# Patient Record
Sex: Female | Born: 1990 | Race: White | Hispanic: No | Marital: Married | Smoking: Never smoker
Health system: Southern US, Community
[De-identification: ages and names within clinical notes are randomized; demographics above are authoritative.]

## PROBLEM LIST (undated history)

## (undated) ENCOUNTER — Inpatient Hospital Stay: Payer: Self-pay

## (undated) DIAGNOSIS — Z8759 Personal history of other complications of pregnancy, childbirth and the puerperium: Secondary | ICD-10-CM

## (undated) DIAGNOSIS — O0930 Supervision of pregnancy with insufficient antenatal care, unspecified trimester: Secondary | ICD-10-CM

## (undated) DIAGNOSIS — Z3480 Encounter for supervision of other normal pregnancy, unspecified trimester: Secondary | ICD-10-CM

## (undated) HISTORY — DX: Personal history of other complications of pregnancy, childbirth and the puerperium: Z87.59

## (undated) HISTORY — PX: IUD REMOVAL: SHX5392

## (undated) HISTORY — DX: Encounter for supervision of other normal pregnancy, unspecified trimester: Z34.80

## (undated) HISTORY — DX: Supervision of pregnancy with insufficient antenatal care, unspecified trimester: O09.30

---

## 2017-11-27 ENCOUNTER — Ambulatory Visit (INDEPENDENT_AMBULATORY_CARE_PROVIDER_SITE_OTHER): Payer: Medicaid Other | Admitting: Obstetrics and Gynecology

## 2017-11-27 ENCOUNTER — Encounter: Payer: Self-pay | Admitting: Radiology

## 2017-11-27 ENCOUNTER — Encounter: Payer: Self-pay | Admitting: Obstetrics and Gynecology

## 2017-11-27 DIAGNOSIS — O0930 Supervision of pregnancy with insufficient antenatal care, unspecified trimester: Secondary | ICD-10-CM | POA: Insufficient documentation

## 2017-11-27 DIAGNOSIS — Z3482 Encounter for supervision of other normal pregnancy, second trimester: Secondary | ICD-10-CM | POA: Diagnosis not present

## 2017-11-27 DIAGNOSIS — Z3480 Encounter for supervision of other normal pregnancy, unspecified trimester: Secondary | ICD-10-CM | POA: Insufficient documentation

## 2017-11-27 DIAGNOSIS — Z8759 Personal history of other complications of pregnancy, childbirth and the puerperium: Secondary | ICD-10-CM

## 2017-11-27 DIAGNOSIS — O0932 Supervision of pregnancy with insufficient antenatal care, second trimester: Secondary | ICD-10-CM

## 2017-11-27 HISTORY — DX: Encounter for supervision of other normal pregnancy, unspecified trimester: Z34.80

## 2017-11-27 HISTORY — DX: Personal history of other complications of pregnancy, childbirth and the puerperium: Z87.59

## 2017-11-27 HISTORY — DX: Supervision of pregnancy with insufficient antenatal care, unspecified trimester: O09.30

## 2017-11-27 NOTE — Progress Notes (Signed)
New OB Note  11/27/2017   Clinic: [facility]  Chief Complaint: NOB  Transfer of Care Patient: no  History of Present Illness: [name] is a 27 y.o. [id number] @ 21/6 weeks (EDC tentative 2/12, based on Patient's last menstrual period was 06/27/2017.).  Preg complicated by has Supervision of other normal pregnancy, antepartum and Late prenatal care on their problem list.   Any events prior to today's visit: pt found out she was pregnant prior to moving here. No PNC yet She was using no method when she conceived.  She has Negative signs or symptoms of miscarriage or preterm labor On any medications around the time she conceived/early pregnancy: No   ROS: A 12-point review of systems was performed and negative, except as stated in the above HPI.  OBGYN History: As per HPI. OB History  Gravida Para Term Preterm AB Living  5 4 4     4   SAB TAB Ectopic Multiple Live Births               # Outcome Date GA Lbr Len/2nd Weight Sex Delivery Anes PTL Lv  5 Current           4 Term           3 Term           2 Term           1 Term             Obstetric Comments  All vaginal. Largest was 9lbs 12oz with G4 pregnancy in 2017    Any issues with any prior pregnancies: IOL with G1 pregnancy a few days before due date for what sounds like oligo Prior children are healthy, doing well, and without any problems or issues: yes History of pap smears: Yes. Last pap smear she believes with 2017 pregnancy and results were negative   Past Medical History: History reviewed. No pertinent past medical history.  Past Surgical History: Past Surgical History:  Procedure Laterality Date  . IUD REMOVAL     from right pelvis    Family History:  History reviewed. No pertinent family history. She denies any history of mental retardation, birth defects or genetic disorders in her or the FOB's history  Social History:  Social History   Socioeconomic History  . Marital  status: Married    Spouse name: Not on file  . Number of children: Not on file  . Years of education: Not on file  . Highest education level: Not on file  Occupational History  . Not on file  Social Needs  . Financial resource strain: Not on file  . Food insecurity:    Worry: Not on file    Inability: Not on file  . Transportation needs:    Medical: Not on file    Non-medical: Not on file  Tobacco Use  . Smoking status: Not on file  Substance and Sexual Activity  . Alcohol use: Not on file  . Drug use: Not on file  . Sexual activity: Not on file  Lifestyle  . Physical activity:    Days per week: Not on file    Minutes per session: Not on file  . Stress: Not on file  Relationships  . Social connections:    Talks on phone: Not on file    Gets together: Not on file    Attends religious service: Not on file    Active member of club or organization:  Not on file    Attends meetings of clubs or organizations: Not on file    Relationship status: Not on file  . Intimate partner violence:    Fear of current or ex partner: Not on file    Emotionally abused: Not on file    Physically abused: Not on file    Forced sexual activity: Not on file  Other Topics Concern  . Not on file  Social History Narrative  . Not on file    Allergy: No Known Allergies  Health Maintenance:  Mammogram Up to Date: not applicable  Current Outpatient Medications: PNV  Physical Exam:   BP 112/68   Pulse 96   Ht 5\' 11"  (1.803 m)   Wt 175 lb 3.2 oz (79.5 kg)   LMP 06/27/2017   BMI 24.44 kg/m  Body mass index is 24.44 kg/m. Contractions: Not present Vag. Bleeding: None. Fundal height: 21 FHTs: 140s  General appearance: Well nourished, well developed female in no acute distress.  Neck:  Supple, normal appearance, and no thyromegaly  Cardiovascular: S1, S2 normal, no murmur, rub or gallop, regular rate and rhythm Respiratory:  Clear to auscultation bilateral. Normal respiratory  effort Abdomen: positive bowel sounds and no masses, hernias; diffusely non tender to palpation, non distended Breasts: patient denies any breast s/. Neuro/Psych:  Normal mood and affect.  Skin:  Warm and dry.   Laboratory: none  Imaging:  None. Pt states she hasn't had an u/s yet  Assessment: pt doing well  Plan: 1. Supervision of other normal pregnancy, antepartum Routine care. Declines genetics. Finalize edc after 1st u/s. Will have pt sign ROI to get pap records from [facility] - [place] MFM OB COMP + 14 WK; Future - Culture, OB Urine - Obstetric Panel, Including HIV  2. G1 Oligo near [facility] Consider AFI close to [facility]  Problem list reviewed and updated.  Follow up in 2 weeks.  The nature of [facility] with multiple MDs and other Advanced Practice Providers was explained to patient; also emphasized that residents, students are part of our team.  >50% of 20 min visit spent on counseling and coordination of care.     Cornelia Copa MD Attending Center for [facility])

## 2017-11-28 LAB — OBSTETRIC PANEL, INCLUDING HIV
Antibody Screen: NEGATIVE
Basophils Absolute: 0.1 10*3/uL (ref 0.0–0.2)
Basos: 1 %
EOS (ABSOLUTE): 0.1 10*3/uL (ref 0.0–0.4)
Eos: 1 %
HEMOGLOBIN: 12.4 g/dL (ref 11.1–15.9)
HIV Screen 4th Generation wRfx: NONREACTIVE
Hematocrit: 36.9 % (ref 34.0–46.6)
Hepatitis B Surface Ag: NEGATIVE
IMMATURE GRANULOCYTES: 0 %
Immature Grans (Abs): 0 10*3/uL (ref 0.0–0.1)
Lymphocytes Absolute: 2.4 10*3/uL (ref 0.7–3.1)
Lymphs: 30 %
MCH: 29.7 pg (ref 26.6–33.0)
MCHC: 33.6 g/dL (ref 31.5–35.7)
MCV: 89 fL (ref 79–97)
MONOCYTES: 6 %
Monocytes Absolute: 0.5 10*3/uL (ref 0.1–0.9)
NEUTROS PCT: 62 %
Neutrophils Absolute: 4.8 10*3/uL (ref 1.4–7.0)
Platelets: 190 10*3/uL (ref 150–450)
RBC: 4.17 x10E6/uL (ref 3.77–5.28)
RDW: 13.4 % (ref 12.3–15.4)
RPR Ser Ql: NONREACTIVE
Rh Factor: POSITIVE
Rubella Antibodies, IGG: 1.4 index (ref >0.99)
WBC: 7.8 10*3/uL (ref 3.4–10.8)

## 2017-11-29 ENCOUNTER — Ambulatory Visit (HOSPITAL_COMMUNITY)
Admission: RE | Admit: 2017-11-29 | Discharge: 2017-11-29 | Disposition: A | Discharge status: Home / Self Care | Payer: Medicaid Other | Source: Ambulatory Visit | Attending: Obstetrics and Gynecology | Admitting: Obstetrics and Gynecology

## 2017-11-29 DIAGNOSIS — Z363 Encounter for antenatal screening for malformations: Secondary | ICD-10-CM | POA: Diagnosis present

## 2017-11-29 DIAGNOSIS — Z3A22 22 weeks gestation of pregnancy: Secondary | ICD-10-CM | POA: Insufficient documentation

## 2017-11-29 DIAGNOSIS — Z3480 Encounter for supervision of other normal pregnancy, unspecified trimester: Secondary | ICD-10-CM

## 2017-11-30 LAB — CULTURE, OB URINE

## 2017-11-30 LAB — URINE CULTURE, OB REFLEX: ORGANISM ID, BACTERIA: NO GROWTH

## 2017-12-03 ENCOUNTER — Encounter: Payer: Self-pay | Admitting: Obstetrics and Gynecology

## 2017-12-03 DIAGNOSIS — Z3480 Encounter for supervision of other normal pregnancy, unspecified trimester: Secondary | ICD-10-CM

## 2017-12-11 ENCOUNTER — Ambulatory Visit (INDEPENDENT_AMBULATORY_CARE_PROVIDER_SITE_OTHER): Payer: Medicaid Other | Admitting: Obstetrics & Gynecology

## 2017-12-11 DIAGNOSIS — Z3480 Encounter for supervision of other normal pregnancy, unspecified trimester: Secondary | ICD-10-CM

## 2017-12-11 NOTE — Progress Notes (Signed)
PRENATAL VISIT NOTE  Subjective:  Teresa Horn is a 27 y.o. G5P4004 at [redacted]w[redacted]d being seen today for ongoing prenatal care.  She is currently monitored for the following issues for this low-risk pregnancy and has Supervision of other normal pregnancy, antepartum; Late prenatal care; and History of oligohydramnios on their problem list.  Patient reports no complaints.  Contractions: Not present. Vag. Bleeding: None.  Movement: Present. Denies leaking of fluid.   The following portions of the patient's history were reviewed and updated as appropriate: allergies, current medications, past family history, past medical history, past social history, past surgical history and problem list. Problem list updated.  Objective:   Vitals:   12/11/17 1014  BP: (!) 97/54  Pulse: 79  Weight: 177 lb (80.3 kg)    Fetal Status: Fetal Heart Rate (bpm): 131 Fundal Height: 24 cm Movement: Present     General:  Alert, oriented and cooperative. Patient is in no acute distress.  Skin: Skin is warm and dry. No rash noted.   Cardiovascular: Normal heart rate noted  Respiratory: Normal respiratory effort, no problems with respiration noted  Abdomen: Soft, gravid, appropriate for gestational age.  Pain/Pressure: Absent     Pelvic: Cervical exam deferred        Extremities: Normal range of motion.  Edema: None  Mental Status: Normal mood and affect. Normal behavior. Normal judgment and thought content.   [place] Mfm Ob Comp + 14 Wk  Result Date: 11/30/2017 ----------------------------------------------------------------------  OBSTETRICS REPORT                        (Signed Final 11/30/2017 01:17 am) ---------------------------------------------------------------------- Patient Info  ID #:       [id number]                          D.O.B.:  19-Jun-1990 (27 yrs)  Name:       [name]                 Visit Date: 11/29/2017 03:34 pm ---------------------------------------------------------------------- Performed By   Performed By:     Lenise Arena        Ref. Address:      [address]                                                              [place], [place]                                                              [address]  Attending:  Corenthian Booker      Location:          [facility]                    MD  Referred By:      [place] Bing MD ---------------------------------------------------------------------- Orders   #  Description                          Code         Ordered By   1  [place] MFM OB COMP + 14 WK               76805.01     CHARLIE PICKENS  ----------------------------------------------------------------------   #  Order #                    Accession #                 Episode #   1  [id number]                  [id number]                  [id number]  ---------------------------------------------------------------------- Indications   Encounter for antenatal screening for          Z36.3   malformations   [redacted] weeks gestation of pregnancy                Z3A.22  ---------------------------------------------------------------------- Vital Signs  Weight (lb): 175                               Height:        5'11"  BMI:         24.4 ---------------------------------------------------------------------- Fetal Evaluation  Num Of Fetuses:          1  Fetal Heart Rate(bpm):   136  Cardiac Activity:        Observed  Presentation:            Cephalic  Placenta:                Anterior  P. Cord Insertion:       Visualized, central  Amniotic Fluid  AFI FV:      Within normal limits                              Largest Pocket(cm)                              5.84 ---------------------------------------------------------------------- Biometry  BPD:      50.6  mm     G. Age:  21w 2d         18  %    CI:        73.25   %    70 - 86  FL/HC:        19.6  %    18.4 - 20.2  HC:      187.9  mm     G. Age:  21w 1d          7  %    HC/AC:       1.16       1.06 - 1.25  AC:      162.6  mm     G. Age:  21w 3d         20  %    FL/BPD:      72.9  %    71 - 87  FL:       36.9  mm     G. Age:  21w 5d         26  %    FL/AC:       22.7  %    20 - 24  HUM:      35.5  mm     G. Age:  22w 2d         51  %  CER:      22.4  mm     G. Age:  21w 1d         24  %  LV:        4.5  mm  CM:        4.4  mm  Est. FW:     427   gm   0 lb 15 oz      29  % ---------------------------------------------------------------------- OB History  Gravidity:    5         Term:   4  Living:       4 ---------------------------------------------------------------------- Gestational Age  LMP:           22w 1d        Date:  06/27/17                 EDD:   04/03/18  U/S Today:     21w 3d                                        EDD:   04/08/18  Best:          22w 1d     Det. By:  LMP  (06/27/17)          EDD:   04/03/18 ---------------------------------------------------------------------- Anatomy  Cranium:               Appears normal         LVOT:                   Appears normal  Cavum:                 Appears normal         Aortic Arch:            Appears normal  Ventricles:            Appears normal         Ductal Arch:            Appears normal  Choroid Plexus:        Appears normal         Diaphragm:  Appears normal  Cerebellum:            Appears normal         Stomach:                Appears normal, left                                                                        sided  Posterior Fossa:       Appears normal         Abdomen:                Appears normal  Nuchal Fold:           Appears normal         Abdominal Wall:         Appears nml (cord                                                                        insert, abd wall)  Face:                  Appears normal         Cord Vessels:           Appears normal (3                         (orbits and profile)                            vessel cord)  Lips:                  Appears normal         Kidneys:                Appear normal  Thoracic:              Appears normal         Bladder:                Appears normal  Heart:                 Appears normal         Upper Extremities:      Appears normal                         (4CH, axis, and situs  RVOT:                  Appears normal         Lower Extremities:      Appears normal  Other:  Female gender Heels and 5th digit visualized. Nasal bone visualized. ---------------------------------------------------------------------- Cervix Uterus Adnexa  Cervix  Length:           4.22  cm.  Normal appearance by transabdominal scan.  Uterus  No  abnormality visualized.  Left Ovary  Within normal limits.  Right Ovary  Within normal limits.  Cul De Sac  No free fluid seen.  Adnexa  No abnormality visualized. ---------------------------------------------------------------------- Comments  Anatomic Survey Complete ---------------------------------------------------------------------- Impression  Normal interval growth.  No ultrasonic evidence of structural  fetal anomalies. ---------------------------------------------------------------------- Recommendations  Follow up as clinically indicated ----------------------------------------------------------------------               Lin Landsman, MD Electronically Signed Final Report   11/30/2017 01:17 am ----------------------------------------------------------------------   Assessment and Plan:  Pregnancy: Z6X0960 at [redacted]w[redacted]d  1. Supervision of other normal pregnancy, antepartum Normal anatomy scan reviewed with patient. Still declines genetic screening. Still declines Flu vaccine. Discussed postpartum contraception, leaning towards Nuvaring. Preterm labor symptoms and general obstetric precautions including but not limited to vaginal bleeding, contractions, leaking of fluid and fetal movement were reviewed in detail with the patient. Please  refer to After Visit Summary for other counseling recommendations.  Return in about 4 weeks (around 01/08/2018) for 2 hr GTT, 3rd trimester labs, TDap, OB Visit.  Future Appointments  Date Time Provider Department Center  01/08/2018  8:45 AM Reva Bores, MD [facility]    Jaynie Collins, MD

## 2017-12-11 NOTE — Patient Instructions (Signed)
TDaP Vaccine Pregnancy Get the Whooping Cough Vaccine While You Are Pregnant (CDC)  It is important for women to get the whooping cough vaccine in the third trimester of each pregnancy. Vaccines are the best way to prevent this disease. There are 2 different whooping cough vaccines. Both vaccines combine protection against whooping cough, tetanus and diphtheria, but they are for different age groups: Tdap: for everyone 11 years or older, including pregnant women  DTaP: for children 2 months through 6 years of age  You need the whooping cough vaccine during each of your pregnancies The recommended time to get the shot is during your 27th through 36th week of pregnancy, preferably during the earlier part of this time period. The Centers for Disease Control and Prevention (CDC) recommends that pregnant women receive the whooping cough vaccine for adolescents and adults (called Tdap vaccine) during the third trimester of each pregnancy. The recommended time to get the shot is during your 27th through 36th week of pregnancy, preferably during the earlier part of this time period. This replaces the original recommendation that pregnant women get the vaccine only if they had not previously received it. The American College of Obstetricians and Gynecologists and the American College of Nurse-Midwives support this recommendation.  You should get the whooping cough vaccine while pregnant to pass protection to your baby frame support disabled and/or not supported in this browser  Learn why Teresa Horn decided to get the whooping cough vaccine in her 3rd trimester of pregnancy and how her baby girl was born with some protection against the disease. Also available on YouTube. After receiving the whooping cough vaccine, your body will create protective antibodies (proteins produced by the body to fight off diseases) and pass some of them to your baby before birth. These antibodies provide your baby some short-term  protection against whooping cough in early life. These antibodies can also protect your baby from some of the more serious complications that come along with whooping cough. Your protective antibodies are at their highest about 2 weeks after getting the vaccine, but it takes time to pass them to your baby. So the preferred time to get the whooping cough vaccine is early in your third trimester. The amount of whooping cough antibodies in your body decreases over time. That is why CDC recommends you get a whooping cough vaccine during each pregnancy. Doing so allows each of your babies to get the greatest number of protective antibodies from you. This means each of your babies will get the best protection possible against this disease.  Getting the whooping cough vaccine while pregnant is better than getting the vaccine after you give birth Whooping cough vaccination during pregnancy is ideal so your baby will have short-term protection as soon as he is born. This early protection is important because your baby will not start getting his whooping cough vaccines until he is 2 months old. These first few months of life are when your baby is at greatest risk for catching whooping cough. This is also when he's at greatest risk for having severe, potentially life-threating complications from the infection. To avoid that gap in protection, it is best to get a whooping cough vaccine during pregnancy. You will then pass protection to your baby before he is born. To continue protecting your baby, he should get whooping cough vaccines starting at 2 months old. You may never have gotten the Tdap vaccine before and did not get it during this pregnancy. If so, you should make sure   to get the vaccine immediately after you give birth, before leaving the hospital or birthing center. It will take about 2 weeks before your body develops protection (antibodies) in response to the vaccine. Once you have protection from the vaccine,  you are less likely to give whooping cough to your newborn while caring for him. But remember, your baby will still be at risk for catching whooping cough from others. A recent study looked to see how effective Tdap was at preventing whooping cough in babies whose mothers got the vaccine while pregnant or in the hospital after giving birth. The study found that getting Tdap between 27 through 36 weeks of pregnancy is 85% more effective at preventing whooping cough in babies younger than 2 months old. Blood tests cannot tell if you need a whooping cough vaccine There are no blood tests that can tell you if you have enough antibodies in your body to protect yourself or your baby against whooping cough. Even if you have been sick with whooping cough in the past or previously received the vaccine, you still should get the vaccine during each pregnancy. Breastfeeding may pass some protective antibodies onto your baby By breastfeeding, you may pass some antibodies you have made in response to the vaccine to your baby. When you get a whooping cough vaccine during your pregnancy, you will have antibodies in your breast milk that you can share with your baby as soon as your milk comes in. However, your baby will not get protective antibodies immediately if you wait to get the whooping cough vaccine until after delivering your baby. This is because it takes about 2 weeks for your body to create antibodies. Learn more about the health benefits of breastfeeding.  

## 2018-01-04 NOTE — Progress Notes (Signed)
FLU SHO-declined TDAP-declined 2 HOUR GTT

## 2018-01-08 ENCOUNTER — Encounter: Payer: Self-pay | Admitting: Family Medicine

## 2018-01-08 ENCOUNTER — Ambulatory Visit (INDEPENDENT_AMBULATORY_CARE_PROVIDER_SITE_OTHER): Payer: Medicaid Other | Admitting: Family Medicine

## 2018-01-08 DIAGNOSIS — Z3480 Encounter for supervision of other normal pregnancy, unspecified trimester: Secondary | ICD-10-CM

## 2018-01-08 DIAGNOSIS — Z3482 Encounter for supervision of other normal pregnancy, second trimester: Secondary | ICD-10-CM

## 2018-01-08 NOTE — Progress Notes (Signed)
   PRENATAL VISIT NOTE  Subjective:  Teresa Horn is a 27 y.o. G5P4004 at 231w6d being seen today for ongoing prenatal care.  She is currently monitored for the following issues for this low-risk pregnancy and has Supervision of other normal pregnancy, antepartum; Late prenatal care; and History of oligohydramnios on their problem list.  Patient reports no complaints.  Contractions: Not present. Vag. Bleeding: None.  Movement: Present. Denies leaking of fluid.   The following portions of the patient's history were reviewed and updated as appropriate: allergies, current medications, past family history, past medical history, past social history, past surgical history and problem list. Problem list updated.  Objective:   Vitals:   01/08/18 0842  BP: 101/70  Pulse: 86  Weight: 188 lb (85.3 kg)    Fetal Status: Fetal Heart Rate (bpm): 133 Fundal Height: 27 cm Movement: Present     General:  Alert, oriented and cooperative. Patient is in no acute distress.  Skin: Skin is warm and dry. No rash noted.   Cardiovascular: Normal heart rate noted  Respiratory: Normal respiratory effort, no problems with respiration noted  Abdomen: Soft, gravid, appropriate for gestational age.  Pain/Pressure: Present     Pelvic: Cervical exam deferred        Extremities: Normal range of motion.  Edema: None  Mental Status: Normal mood and affect. Normal behavior. Normal judgment and thought content.   Assessment and Plan:  Pregnancy: G5P4004 at 731w6d  1. Supervision of other normal pregnancy, antepartum Declines all vaccinations, limits them to her kids as well - Glucose Tolerance, 2 Hours w/1 Hour - CBC - RPR - Obstetric Panel, Including HIV  Preterm labor symptoms and general obstetric precautions including but not limited to vaginal bleeding, contractions, leaking of fluid and fetal movement were reviewed in detail with the patient. Please refer to After Visit Summary for other counseling  recommendations.  Return in 2 weeks (on 01/22/2018).  Future Appointments  Date Time Provider Department Center  01/21/2018  2:00 PM Reva BoresPratt, Logun Colavito S, MD [facility]    Reva Boresanya S Keo Schirmer, MD

## 2018-01-08 NOTE — Patient Instructions (Signed)

## 2018-01-09 LAB — OBSTETRIC PANEL, INCLUDING HIV
Antibody Screen: NEGATIVE
Basophils Absolute: 0.1 10*3/uL (ref 0.0–0.2)
Basos: 1 %
EOS (ABSOLUTE): 0.1 10*3/uL (ref 0.0–0.4)
Eos: 2 %
HEMATOCRIT: 33.5 % — AB (ref 34.0–46.6)
HIV Screen 4th Generation wRfx: NONREACTIVE
Hemoglobin: 11.4 g/dL (ref 11.1–15.9)
Hepatitis B Surface Ag: NEGATIVE
IMMATURE GRANS (ABS): 0 10*3/uL (ref 0.0–0.1)
IMMATURE GRANULOCYTES: 0 %
LYMPHS ABS: 2.4 10*3/uL (ref 0.7–3.1)
Lymphs: 30 %
MCH: 29.1 pg (ref 26.6–33.0)
MCHC: 34 g/dL (ref 31.5–35.7)
MCV: 86 fL (ref 79–97)
MONOCYTES: 7 %
Monocytes Absolute: 0.6 10*3/uL (ref 0.1–0.9)
NEUTROS ABS: 4.9 10*3/uL (ref 1.4–7.0)
Neutrophils: 60 %
Platelets: 192 10*3/uL (ref 150–450)
RBC: 3.92 x10E6/uL (ref 3.77–5.28)
RDW: 13 % (ref 12.3–15.4)
RH TYPE: POSITIVE
RPR Ser Ql: NONREACTIVE
Rubella Antibodies, IGG: 1.36 index (ref >0.99)
WBC: 8.1 10*3/uL (ref 3.4–10.8)

## 2018-01-09 LAB — GLUCOSE TOLERANCE, 2 HOURS W/ 1HR
GLUCOSE, 2 HOUR: 96 mg/dL (ref 65–152)
Glucose, 1 hour: 84 mg/dL (ref 65–179)
Glucose, Fasting: 75 mg/dL (ref 65–91)

## 2018-01-21 ENCOUNTER — Inpatient Hospital Stay: Payer: Medicaid Other

## 2018-01-21 ENCOUNTER — Ambulatory Visit (INDEPENDENT_AMBULATORY_CARE_PROVIDER_SITE_OTHER): Payer: Medicaid Other | Admitting: Family Medicine

## 2018-01-21 ENCOUNTER — Inpatient Hospital Stay
Admission: EM | Admit: 2018-01-21 | Discharge: 2018-01-21 | Disposition: A | Discharge status: Home / Self Care | Payer: Medicaid Other | Attending: Advanced Practice Midwife | Admitting: Advanced Practice Midwife

## 2018-01-21 ENCOUNTER — Other Ambulatory Visit: Payer: Self-pay

## 2018-01-21 VITALS — BP 107/69 | HR 92 | Wt 190.0 lb

## 2018-01-21 DIAGNOSIS — Z3480 Encounter for supervision of other normal pregnancy, unspecified trimester: Secondary | ICD-10-CM

## 2018-01-21 DIAGNOSIS — B9689 Other specified bacterial agents as the cause of diseases classified elsewhere: Secondary | ICD-10-CM | POA: Diagnosis not present

## 2018-01-21 DIAGNOSIS — N136 Pyonephrosis: Secondary | ICD-10-CM | POA: Diagnosis not present

## 2018-01-21 DIAGNOSIS — Z3A30 30 weeks gestation of pregnancy: Secondary | ICD-10-CM | POA: Diagnosis not present

## 2018-01-21 DIAGNOSIS — Z3483 Encounter for supervision of other normal pregnancy, third trimester: Secondary | ICD-10-CM

## 2018-01-21 DIAGNOSIS — O2303 Infections of kidney in pregnancy, third trimester: Secondary | ICD-10-CM | POA: Insufficient documentation

## 2018-01-21 DIAGNOSIS — R109 Unspecified abdominal pain: Secondary | ICD-10-CM

## 2018-01-21 DIAGNOSIS — O0933 Supervision of pregnancy with insufficient antenatal care, third trimester: Secondary | ICD-10-CM | POA: Diagnosis not present

## 2018-01-21 LAB — URINALYSIS, ROUTINE W REFLEX MICROSCOPIC
Bilirubin Urine: NEGATIVE
Glucose, UA: >=500 mg/dL — AB
Ketones, ur: NEGATIVE mg/dL
Nitrite: NEGATIVE
Protein, ur: NEGATIVE mg/dL
Specific Gravity, Urine: 1.006 (ref 1.005–1.030)
pH: 6 (ref 5.0–8.0)

## 2018-01-21 LAB — GLUCOSE, CAPILLARY: Glucose-Capillary: 87 mg/dL (ref 70–99)

## 2018-01-21 MED ORDER — MORPHINE SULFATE (PF) 2 MG/ML IV SOLN
INTRAVENOUS | Status: AC
  Administered 2018-01-21: 1 mg via INTRAVENOUS
  Filled 2018-01-21: qty 1

## 2018-01-21 MED ORDER — LACTATED RINGERS IV SOLN
INTRAVENOUS | Status: DC
  Administered 2018-01-21: 125 mL/h via INTRAVENOUS

## 2018-01-21 MED ORDER — PHENAZOPYRIDINE HCL 200 MG PO TABS: 200.0000 mg | ORAL_TABLET | Freq: Three times a day (TID) | ORAL | 0 refills | Status: DC | PRN

## 2018-01-21 MED ORDER — CEPHALEXIN 500 MG PO CAPS: 500.0000 mg | ORAL_CAPSULE | Freq: Four times a day (QID) | ORAL | 0 refills | Status: AC

## 2018-01-21 MED ORDER — MORPHINE SULFATE (PF) 2 MG/ML IV SOLN
1.0000 mg | Freq: Once | INTRAVENOUS | Status: AC
  Administered 2018-01-21: 1 mg via INTRAVENOUS

## 2018-01-21 MED ORDER — MORPHINE SULFATE (PF) 2 MG/ML IV SOLN: 2.0000 mg | INTRAVENOUS | Status: DC | PRN

## 2018-01-21 MED ORDER — ACETAMINOPHEN-CODEINE #3 300-30 MG PO TABS: 1.0000 | ORAL_TABLET | ORAL | 0 refills | Status: AC | PRN

## 2018-01-21 MED ORDER — CEFAZOLIN SODIUM-DEXTROSE 2-4 GM/100ML-% IV SOLN
2.0000 g | Freq: Once | INTRAVENOUS | Status: AC
  Administered 2018-01-21: 2 g via INTRAVENOUS
  Filled 2018-01-21: qty 100

## 2018-01-21 NOTE — Patient Instructions (Signed)

## 2018-01-21 NOTE — Discharge Summary (Signed)
Physician Discharge Summary   Patient ID: Teresa Horn [id number] 27 y.o. Jun 20, 1990  Admit date: 01/21/2018  Discharge date and time: No discharge date for patient encounter.   Admitting Physician: Natale Milch, MD   Discharge Physician: Adelene Idler MD  Admission Diagnoses: 30 Weeks Contractions  Discharge Diagnoses: Pyelonephritis  Admission Condition: good  Discharged Condition: good  Indication for Admission: Evaluation for back pain  Hospital Course: Was admited for evaluation of back pain. Was found to have symptoms consistent with pyelonephritis. Was given IV antibiotics and discharged home with antibiotics and pain medication.  Consults: None  Significant Diagnostic Studies: UA and renal US  Treatments: IV hydration and antibiotics: Ancef  Discharge Exam: BP 130/78 (BP Location: Right Arm)   Pulse 80   Temp 97.8 F (36.6 C) (Oral)   Resp 20   Ht 5\' 11"  (1.803 m)   Wt 86.2 kg   LMP 06/27/2017   BMI 26.50 kg/m   General Appearance:    Alert, cooperative, no distress, appears stated age  Head:    Normocephalic, without obvious abnormality, atraumatic  Eyes:    PERRL, conjunctiva/corneas clear, EOM's intact, fundi    benign, both eyes  Ears:    Normal TM's and external ear canals, both ears  Nose:   Nares normal, septum midline, mucosa normal, no drainage    or sinus tenderness  Throat:   Lips, mucosa, and tongue normal; teeth and gums normal  Neck:   Supple, symmetrical, trachea midline, no adenopathy;    thyroid:  no enlargement/tenderness/nodules; no carotid   bruit or JVD  Back:     Symmetric, no curvature, ROM normal, no CVA tenderness  Lungs:     Clear to auscultation bilaterally, respirations unlabored  Chest Wall:    No tenderness or deformity   Heart:    Regular rate and rhythm, S1 and S2 normal, no murmur, rub   or gallop  Breast Exam:    No tenderness, masses, or nipple abnormality  Abdomen:     Soft, non-tender, bowel sounds  active all four quadrants,    no masses, no organomegaly  Genitalia:    Normal female without lesion, discharge or tenderness  Rectal:    Normal tone, normal prostate, no masses or tenderness;   guaiac negative stool  Extremities:   Extremities normal, atraumatic, no cyanosis or edema  Pulses:   2+ and symmetric all extremities  Skin:   Skin color, texture, turgor normal, no rashes or lesions  Lymph nodes:   Cervical, supraclavicular, and axillary nodes normal  Neurologic:   CNII-XII intact, normal strength, sensation and reflexes    throughout    Disposition:   Patient Instructions:  Allergies as of 01/21/2018   No Known Allergies     Medication List    TAKE these medications   acetaminophen-codeine 300-30 MG tablet Commonly known as:  TYLENOL #3 Take 1 tablet by mouth every 4 (four) hours as needed for up to 4 days for severe pain.   cephALEXin 500 MG capsule Commonly known as:  KEFLEX Take 1 capsule (500 mg total) by mouth 4 (four) times daily for 10 days.   phenazopyridine 200 MG tablet Commonly known as:  PYRIDIUM Take 1 tablet (200 mg total) by mouth 3 (three) times daily as needed for pain (urethral spasm).   PRENATAL VITAMINS PO Take by mouth.      Activity: activity as tolerated Diet: regular diet Wound Care: none needed  Follow-up with [facility] in 1  day.  Signed:  R  01/21/2018 8:41 AM

## 2018-01-21 NOTE — OB Triage Note (Signed)
Pt c/o Left sided flank pain since 12/1.  No LOF.  No bleeding.  Normal fetal movement.  Pt states pain woke her up at 0430 10/10.  Pt reports increased urinary frequency and urgency for the past 2-3 days.

## 2018-01-21 NOTE — Progress Notes (Signed)
   PRENATAL VISIT NOTE  Subjective:  Teresa Horn is a 27 y.o. G5P4004 at 6195w5d being seen today for ongoing prenatal care.  She is currently monitored for the following issues for this low-risk pregnancy and has Supervision of other normal pregnancy, antepartum; Late prenatal care; and History of oligohydramnios on their problem list.  Patient reports backache and dysuria. Seen earlier today at [facility] and diagnosed with left sided pyelonephritis. Based on dirty urine and left CVA tenderness. Given IVF and IV Rocephin and po Abx. Also given IV pain meds and then pain worsened following discharge from there. Had intended to go to [place] tomorrow [place] [employer].  Contractions: Not present. Vag. Bleeding: None.  Movement: Present. Denies leaking of fluid.   The following portions of the patient's history were reviewed and updated as appropriate: allergies, current medications, past family history, past medical history, past social history, past surgical history and problem list. Problem list updated.  Objective:   Vitals:   01/21/18 1409  BP: 107/69  Pulse: 92  Weight: 190 lb (86.2 kg)    Fetal Status: Fetal Heart Rate (bpm): 142   Movement: Present     General:  Alert, oriented and cooperative. Patient is in no acute distress.  Skin: Skin is warm and dry. No rash noted.   Cardiovascular: Normal heart rate noted  Respiratory: Normal respiratory effort, no problems with respiration noted  Abdomen: Soft, gravid, appropriate for gestational age.  Pain/Pressure: Absent     Pelvic: Cervical exam deferred        Extremities: Normal range of motion.  Edema: None  Mental Status: Normal mood and affect. Normal behavior. Normal judgment and thought content.   Assessment and Plan:  Pregnancy: G5P4004 at 395w5d  1. Supervision of other normal pregnancy, antepartum Normal 2 hour Continue routine prenatal care.  2. Left CVA tenderness ? Stone vs. Pyelo, on appropriate Abx. No fever--encouraged to  return for inpatient stay if fever develops. Likely associated with stone +/- infection. Push fluids.  Preterm labor symptoms and general obstetric precautions including but not limited to vaginal bleeding, contractions, leaking of fluid and fetal movement were reviewed in detail with the patient. Please refer to After Visit Summary for other counseling recommendations.  Return in 2 weeks (on 02/04/2018).  Future Appointments  Date Time Provider Department Center  02/04/2018  2:15 PM Reva BoresPratt, Tanya S, MD [facility]    Reva Boresanya S Pratt, MD

## 2018-01-21 NOTE — H&P (Signed)
Triage Note  Teresa Horn is an 27 y.o. female.  HPI: Teresa Horn presents today with complaints of severe left sided back pain. The pain started overnight. She has had similar pain before when she had a kidney stone and when she had a bladder infection. She has not had pain with urination. She has not noticed blood in her urine. She has noticed increased frequency. When she goes to the bathroom she only goes a small amount. She has been receiving prenatal care with [facility]. She reports she has an appointment with them later today. She also is planning on going to [place] tomorrow for a babymoon trip. Her pregnancy has been complicated by late prenatal care.   No past medical history on file.  Past Surgical History:  Procedure Laterality Date  . IUD REMOVAL     from right pelvis    No family history on file.  Social History:  has no tobacco, alcohol, and drug history on file.  Allergies: No Known Allergies  Medications: I have reviewed the patient's current medications.  Results for orders placed or performed during the hospital encounter of 01/21/18 (from the past 48 hour(s))  Urinalysis, Routine w reflex microscopic     Status: Abnormal   Collection Time: 01/21/18  5:42 AM  Result Value Ref Range   Color, Urine YELLOW (A) YELLOW   APPearance HAZY (A) CLEAR   Specific Gravity, Urine 1.006 1.005 - 1.030   pH 6.0 5.0 - 8.0   Glucose, UA >=500 (A) NEGATIVE mg/dL   Hgb urine dipstick SMALL (A) NEGATIVE   Bilirubin Urine NEGATIVE NEGATIVE   Ketones, ur NEGATIVE NEGATIVE mg/dL   Protein, ur NEGATIVE NEGATIVE mg/dL   Nitrite NEGATIVE NEGATIVE   Leukocytes, UA TRACE (A) NEGATIVE   RBC / HPF 6-10 0 - 5 RBC/hpf   WBC, UA 0-5 0 - 5 WBC/hpf   Bacteria, UA FEW (A) NONE SEEN   Squamous Epithelial / LPF 6-10 0 - 5   Mucus PRESENT    Amorphous Crystal PRESENT     Comment: Performed at [facility], [address] [place], [place] [address]    US Renal  Result Date:  01/21/2018 CLINICAL DATA:  Initial evaluation for acute flank pain. Currently pregnant. EXAM: RENAL / URINARY TRACT ULTRASOUND COMPLETE COMPARISON:  None available. FINDINGS: Right Kidney: Renal measurements: 12.5 x 5.8 x 6.2 cm = volume: 233.7 mL . Echogenicity within normal limits. No mass lesion. Moderate hydronephrosis. No shadowing echogenic calculi. Left Kidney: Renal measurements: 13.8 x 7.1 x 5.4 cm = volume: 276.8 mL. Echogenicity within normal limits. No mass lesion. Moderate hydronephrosis. No shadowing echogenic calculi. Bladder: Appears normal for degree of bladder distention. IMPRESSION: Fairly symmetric moderate bilateral hydronephrosis, suspected to be related to current pregnancy. Possible distal obstructive stones would be difficult to exclude. Correlation with urinalysis suggested. Electronically Signed   By: Rise Mu M.D.   On: 01/21/2018 06:29    Review of Systems  Constitutional: Negative for chills, fever, malaise/fatigue and weight loss.  HENT: Negative for congestion, hearing loss and sinus pain.   Eyes: Negative for blurred vision and double vision.  Respiratory: Negative for cough, sputum production, shortness of breath and wheezing.   Cardiovascular: Negative for chest pain, palpitations, orthopnea and leg swelling.  Gastrointestinal: Negative for abdominal pain, constipation, diarrhea, nausea and vomiting.  Genitourinary: Negative for dysuria, flank pain, frequency, hematuria and urgency.  Musculoskeletal: Negative for back pain, falls and joint pain.  Skin: Negative for itching and  rash.  Neurological: Negative for dizziness and headaches.  Psychiatric/Behavioral: Negative for depression, substance abuse and suicidal ideas. The patient is not nervous/anxious.    Blood pressure 130/78, pulse 80, temperature 97.8 F (36.6 C), temperature source Oral, resp. rate 20, height 5\' 11"  (1.803 m), weight 86.2 kg, last menstrual period 06/27/2017. Physical Exam   Nursing note and vitals reviewed. Constitutional: She is oriented to person, place, and time. She appears well-developed and well-nourished.  HENT:  Head: Normocephalic and atraumatic.  Cardiovascular: Normal rate and regular rhythm.  Respiratory: Effort normal and breath sounds normal.  GI: Soft. Bowel sounds are normal.  Left sided CVA tenderness  Musculoskeletal: Normal range of motion.  Neurological: She is alert and oriented to person, place, and time.  Skin: Skin is warm and dry.  Psychiatric: She has a normal mood and affect. Her behavior is normal. Judgment and thought content normal.    Assessment/Plan: 27 yo G5P4004 5218w5d 1. Pyelonephritis- will treat with a dose of IV antibiotics here x1 followed by oral antibiotics for 10 days. Pyridium PRN dysuria. Close follow up planned for this afternoon with OBGYN. Culture pending. Patient reports that her phone will work while she is in [place].   Teresa Horn R Brean Carberry 01/21/2018, 7:56 AM

## 2018-01-21 NOTE — Discharge Instructions (Signed)
Pyelonephritis, Adult Pyelonephritis is a kidney infection. The kidneys are organs that help clean your blood by moving waste out of your blood and into your pee (urine). This infection can happen quickly, or it can last for a long time. In most cases, it clears up with treatment and does not cause other problems. Follow these instructions at home: Medicines  Take over-the-counter and prescription medicines only as told by your doctor.  Take your antibiotic medicine as told by your doctor. Do not stop taking the medicine even if you start to feel better. General instructions  Drink enough fluid to keep your pee clear or pale yellow.  Avoid caffeine, tea, and carbonated drinks.  Pee (urinate) often. Avoid holding in pee for long periods of time.  Pee before and after sex.  After pooping (having a bowel movement), women should wipe from front to back. Use each tissue only once.  Keep all follow-up visits as told by your doctor. This is important. Contact a doctor if:  You do not feel better after 2 days.  Your symptoms get worse.  You have a fever. Get help right away if:  You cannot take your medicine or drink fluids as told.  You have chills and shaking.  You throw up (vomit).  You have very bad pain in your side (flank) or back.  You feel very weak or you pass out (faint). This information is not intended to replace advice given to you by your health care provider. Make sure you discuss any questions you have with your health care provider. Document Released: 03/16/2004 Document Revised: 07/15/2015 Document Reviewed: 06/01/2014 Elsevier Interactive Patient Education  2018 Elsevier Inc.  

## 2018-01-24 LAB — URINE CULTURE
Culture: 10000 — AB
Special Requests: NORMAL

## 2018-01-25 NOTE — Progress Notes (Signed)
Called, no answer, sent to mychart, patient given antibiotics to cover infection at discharge.

## 2018-02-04 ENCOUNTER — Ambulatory Visit (INDEPENDENT_AMBULATORY_CARE_PROVIDER_SITE_OTHER): Payer: Medicaid Other | Admitting: Family Medicine

## 2018-02-04 VITALS — BP 116/77 | HR 101 | Wt 196.0 lb

## 2018-02-04 DIAGNOSIS — Z3480 Encounter for supervision of other normal pregnancy, unspecified trimester: Secondary | ICD-10-CM

## 2018-02-04 DIAGNOSIS — Z3483 Encounter for supervision of other normal pregnancy, third trimester: Secondary | ICD-10-CM

## 2018-02-04 NOTE — Patient Instructions (Signed)

## 2018-02-04 NOTE — Progress Notes (Signed)
   PRENATAL VISIT NOTE  Subjective:  Teresa Horn is a 27 y.o. G5P4004 at 3916w5d being seen today for ongoing prenatal care.  She is currently monitored for the following issues for this low-risk pregnancy and has Supervision of other normal pregnancy, antepartum; Late prenatal care; and History of oligohydramnios on their problem list.  Patient reports no complaints.  Contractions: Not present. Vag. Bleeding: None.  Movement: Present. Denies leaking of fluid.   The following portions of the patient's history were reviewed and updated as appropriate: allergies, current medications, past family history, past medical history, past social history, past surgical history and problem list. Problem list updated.  Objective:   Vitals:   02/04/18 1432  BP: 116/77  Pulse: (!) 101  Weight: 196 lb (88.9 kg)    Fetal Status: Fetal Heart Rate (bpm): 152 Fundal Height: 31 cm Movement: Present     General:  Alert, oriented and cooperative. Patient is in no acute distress.  Skin: Skin is warm and dry. No rash noted.   Cardiovascular: Normal heart rate noted  Respiratory: Normal respiratory effort, no problems with respiration noted  Abdomen: Soft, gravid, appropriate for gestational age.  Pain/Pressure: Present     Pelvic: Cervical exam deferred        Extremities: Normal range of motion.  Edema: None  Mental Status: Normal mood and affect. Normal behavior. Normal judgment and thought content.   Assessment and Plan:  Pregnancy: G5P4004 at 2616w5d  1. Supervision of other normal pregnancy, antepartum Urinary symptoms improved Finished trip 10 days in [place] and no more pain. Continue routine prenatal care.   Preterm labor symptoms and general obstetric precautions including but not limited to vaginal bleeding, contractions, leaking of fluid and fetal movement were reviewed in detail with the patient. Please refer to After Visit Summary for other counseling recommendations.  Return in 2 weeks  (on 02/18/2018).  No future appointments.  Reva Boresanya S Yarethzy Croak, MD

## 2018-02-11 ENCOUNTER — Telehealth: Payer: Self-pay | Admitting: *Deleted

## 2018-02-11 ENCOUNTER — Encounter: Payer: Self-pay | Admitting: *Deleted

## 2018-02-11 NOTE — Telephone Encounter (Signed)
Called patient to get fax number of Dentist to fax over letter to treat.

## 2018-02-20 NOTE — L&D Delivery Note (Addendum)
Delivery Note Presented via EMS with urge to push   Cervix complete/+2+3  At 10:31 PM a viable and healthy female was delivered via Vaginal, Spontaneous (Presentation:OA  ).  APGAR: 8, 9; weight  .   Placenta status: spontaneous and grossly intact with 3 vessel Cord:  with the following complications: none   No difficulty with shoulders  Anesthesia:   Episiotomy: None Lacerations: 1st degree;Perineal and left labial abrasion Suture Repair: 3.0 vicryl rapide Est. Blood Loss (mL): 200  Mom to postpartum.  Baby to Couplet care / Skin to Skin.  Wynelle Bourgeois 04/04/2018, 11:21 PM  Please schedule this patient for Postpartum visit at [facility] in: 4 weeks with the following provider: Any provider For C/S patients schedule nurse incision check in weeks 2 weeks: no Low risk pregnancy complicated by: none Delivery mode:  SVD Anticipated Birth Control:  other/unsure PP Procedures needed: none  Schedule Integrated BH visit: no

## 2018-02-21 ENCOUNTER — Ambulatory Visit (INDEPENDENT_AMBULATORY_CARE_PROVIDER_SITE_OTHER): Payer: Medicaid Other | Admitting: Obstetrics and Gynecology

## 2018-02-21 VITALS — BP 113/74 | HR 98 | Wt 194.0 lb

## 2018-02-21 DIAGNOSIS — Z3483 Encounter for supervision of other normal pregnancy, third trimester: Secondary | ICD-10-CM

## 2018-02-21 DIAGNOSIS — Z3480 Encounter for supervision of other normal pregnancy, unspecified trimester: Secondary | ICD-10-CM

## 2018-02-21 NOTE — Progress Notes (Signed)
Prenatal Visit Note Date: 02/21/2018 Clinic: [facility]  Subjective:  Teresa Horn is a 29 y.o. G5P4004 at [redacted]w[redacted]d being seen today for ongoing prenatal care.  She is currently monitored for the following issues for this low-risk pregnancy and has Supervision of other normal pregnancy, antepartum; Late prenatal care; and History of oligohydramnios on their problem list.  Patient reports no complaints.   Contractions: Irregular. Vag. Bleeding: None.  Movement: Present. Denies leaking of fluid.   The following portions of the patient's history were reviewed and updated as appropriate: allergies, current medications, past family history, past medical history, past social history, past surgical history and problem list. Problem list updated.  Objective:   Vitals:   02/21/18 1114  BP: 113/74  Pulse: 98  Weight: 194 lb (88 kg)    Fetal Status: Fetal Heart Rate (bpm): 151 Fundal Height: 34 cm Movement: Present  Presentation: Vertex  General:  Alert, oriented and cooperative. Patient is in no acute distress.  Skin: Skin is warm and dry. No rash noted.   Cardiovascular: Normal heart rate noted  Respiratory: Normal respiratory effort, no problems with respiration noted  Abdomen: Soft, gravid, appropriate for gestational age. Pain/Pressure: Present     Pelvic:  Cervical exam deferred        Extremities: Normal range of motion.  Edema: None  Mental Status: Normal mood and affect. Normal behavior. Normal judgment and thought content.   Urinalysis:      Assessment and Plan:  Pregnancy: G5P4004 at [redacted]w[redacted]d  1. Supervision of other normal pregnancy, antepartum Routine care. Unsure about BC. D/w her re: options. Husband doesn't want vasectomy. D/w her re: 30d papers with medicaid. Pt to consider options  Preterm labor symptoms and general obstetric precautions including but not limited to vaginal bleeding, contractions, leaking of fluid and fetal movement were reviewed in  detail with the patient. Please refer to After Visit Summary for other counseling recommendations.  Return in about 1 week (around 02/28/2018) for 7-10d ro b.   Knollwood Bing, MD

## 2018-03-06 ENCOUNTER — Encounter: Payer: Self-pay | Admitting: Family Medicine

## 2018-03-06 ENCOUNTER — Ambulatory Visit (INDEPENDENT_AMBULATORY_CARE_PROVIDER_SITE_OTHER): Payer: Medicaid Other | Admitting: Family Medicine

## 2018-03-06 VITALS — BP 109/74 | HR 84 | Wt 201.0 lb

## 2018-03-06 DIAGNOSIS — Z3483 Encounter for supervision of other normal pregnancy, third trimester: Secondary | ICD-10-CM

## 2018-03-06 DIAGNOSIS — Z3A36 36 weeks gestation of pregnancy: Secondary | ICD-10-CM

## 2018-03-06 DIAGNOSIS — Z3480 Encounter for supervision of other normal pregnancy, unspecified trimester: Secondary | ICD-10-CM

## 2018-03-06 DIAGNOSIS — Z8759 Personal history of other complications of pregnancy, childbirth and the puerperium: Secondary | ICD-10-CM

## 2018-03-06 DIAGNOSIS — O0930 Supervision of pregnancy with insufficient antenatal care, unspecified trimester: Secondary | ICD-10-CM

## 2018-03-06 DIAGNOSIS — O0933 Supervision of pregnancy with insufficient antenatal care, third trimester: Secondary | ICD-10-CM

## 2018-03-06 NOTE — Progress Notes (Signed)
   PRENATAL VISIT NOTE  Subjective:  Teresa Horn is a 28 y.o. G5P4004 at [redacted]w[redacted]d being seen today for ongoing prenatal care.  She is currently monitored for the following issues for this low-risk pregnancy and has Supervision of other normal pregnancy, antepartum; Late prenatal care; and History of oligohydramnios on their problem list.  Patient reports no complaints.  Contractions: Irregular. Vag. Bleeding: None.  Movement: Present. Denies leaking of fluid.   The following portions of the patient's history were reviewed and updated as appropriate: allergies, current medications, past family history, past medical history, past social history, past surgical history and problem list. Problem list updated.  Objective:   Vitals:   03/06/18 1103  BP: 109/74  Pulse: 84  Weight: 201 lb (91.2 kg)    Fetal Status: Fetal Heart Rate (bpm): 131   Movement: Present     General:  Alert, oriented and cooperative. Patient is in no acute distress.  Skin: Skin is warm and dry. No rash noted.   Cardiovascular: Normal heart rate noted  Respiratory: Normal respiratory effort, no problems with respiration noted  Abdomen: Soft, gravid, appropriate for gestational age.  Pain/Pressure: Present     Pelvic: Cervical exam deferred        Extremities: Normal range of motion.  Edema: None  Mental Status: Normal mood and affect. Normal behavior. Normal judgment and thought content.   Assessment and Plan:  Pregnancy: G5P4004 at [redacted]w[redacted]d  1. History of oligohydramnios FH normal  2. Late prenatal care Up to date  3. Supervision of other normal pregnancy, antepartum Discussed IOl at 41 wks if needed Reviewed labor precuations  Preterm labor symptoms and general obstetric precautions including but not limited to vaginal bleeding, contractions, leaking of fluid and fetal movement were reviewed in detail with the patient. Please refer to After Visit Summary for other counseling recommendations.   Return in  about 1 week (around 03/13/2018).  Future Appointments  Date Time Provider Department Center  03/13/2018 11:00 AM Calvert Cantor, [place] [facility]  03/20/2018 11:00 AM Federico Flake, MD [facility]  03/27/2018 11:00 AM Calvert Cantor, CNM [facility]    Federico Flake, MD

## 2018-03-06 NOTE — Patient Instructions (Signed)

## 2018-03-13 ENCOUNTER — Other Ambulatory Visit (HOSPITAL_COMMUNITY)
Admission: RE | Admit: 2018-03-13 | Discharge: 2018-03-13 | Disposition: A | Discharge status: Home / Self Care | Payer: Medicaid Other | Source: Ambulatory Visit | Attending: Advanced Practice Midwife | Admitting: Advanced Practice Midwife

## 2018-03-13 ENCOUNTER — Ambulatory Visit (INDEPENDENT_AMBULATORY_CARE_PROVIDER_SITE_OTHER): Payer: Medicaid Other | Admitting: Advanced Practice Midwife

## 2018-03-13 VITALS — BP 108/70 | HR 72 | Wt 202.4 lb

## 2018-03-13 DIAGNOSIS — Z3480 Encounter for supervision of other normal pregnancy, unspecified trimester: Secondary | ICD-10-CM | POA: Insufficient documentation

## 2018-03-13 DIAGNOSIS — Z3483 Encounter for supervision of other normal pregnancy, third trimester: Secondary | ICD-10-CM

## 2018-03-13 DIAGNOSIS — Z8759 Personal history of other complications of pregnancy, childbirth and the puerperium: Secondary | ICD-10-CM

## 2018-03-13 NOTE — Patient Instructions (Signed)

## 2018-03-13 NOTE — Progress Notes (Signed)
   PRENATAL VISIT NOTE  Subjective:  Teresa Horn is a 28 y.o. G5P4004 at [redacted]w[redacted]d being seen today for ongoing prenatal care.  She is currently monitored for the following issues for this low-risk pregnancy and has Supervision of other normal pregnancy, antepartum; Late prenatal care; and History of oligohydramnios on their problem list.  Patient reports no complaints.  Contractions: Irregular. Vag. Bleeding: None.  Movement: Present. Denies leaking of fluid.   The following portions of the patient's history were reviewed and updated as appropriate: allergies, current medications, past family history, past medical history, past social history, past surgical history and problem list. Problem list updated.  Objective:   Vitals:   03/13/18 1107  BP: 108/70  Pulse: 72  Weight: 91.8 kg    Fetal Status: Fetal Heart Rate (bpm): 138 Fundal Height: 37 cm Movement: Present  Presentation: Vertex  General:  Alert, oriented and cooperative. Patient is in no acute distress.  Skin: Skin is warm and dry. No rash noted.   Cardiovascular: Normal heart rate noted  Respiratory: Normal respiratory effort, no problems with respiration noted  Abdomen: Soft, gravid, appropriate for gestational age.  Pain/Pressure: Present     Pelvic: Cervical exam performed      1/thick/posterior  Extremities: Normal range of motion.  Edema: None  Mental Status: Normal mood and affect. Normal behavior. Normal judgment and thought content.   Assessment and Plan:  Pregnancy: G5P4004 at [redacted]w[redacted]d  1. Supervision of other normal pregnancy, antepartum --No complaints or concerns, continue routine care --GBS and GC/C collected today. Discussed PCN for prophylaxis in event of positive GBS  2. History of oligohydramnios --Fundal height appropriate  Term labor symptoms and general obstetric precautions including but not limited to vaginal bleeding, contractions, leaking of fluid and fetal movement were reviewed in detail with  the patient. Please refer to After Visit Summary for other counseling recommendations.  Return in about 1 week (around 03/20/2018).  Future Appointments  Date Time Provider Department Center  03/20/2018 11:00 AM Federico Flake, MD [facility]  03/27/2018 11:00 AM Calvert Cantor, CNM [facility]    Calvert Cantor, [place]

## 2018-03-14 LAB — GC/CHLAMYDIA PROBE AMP (~~LOC~~) NOT AT ARMC
Chlamydia: NEGATIVE
Neisseria Gonorrhea: NEGATIVE

## 2018-03-17 LAB — CULTURE, BETA STREP (GROUP B ONLY): STREP GP B CULTURE: NEGATIVE

## 2018-03-20 ENCOUNTER — Ambulatory Visit (INDEPENDENT_AMBULATORY_CARE_PROVIDER_SITE_OTHER): Payer: Medicaid Other | Admitting: Family Medicine

## 2018-03-20 VITALS — BP 114/74 | HR 72 | Wt 204.6 lb

## 2018-03-20 DIAGNOSIS — Z8759 Personal history of other complications of pregnancy, childbirth and the puerperium: Secondary | ICD-10-CM

## 2018-03-20 DIAGNOSIS — O0930 Supervision of pregnancy with insufficient antenatal care, unspecified trimester: Secondary | ICD-10-CM

## 2018-03-20 DIAGNOSIS — O0933 Supervision of pregnancy with insufficient antenatal care, third trimester: Secondary | ICD-10-CM

## 2018-03-20 DIAGNOSIS — Z3480 Encounter for supervision of other normal pregnancy, unspecified trimester: Secondary | ICD-10-CM

## 2018-03-20 DIAGNOSIS — Z3483 Encounter for supervision of other normal pregnancy, third trimester: Secondary | ICD-10-CM

## 2018-03-20 NOTE — Progress Notes (Signed)
   PRENATAL VISIT NOTE  Subjective:  Teresa Horn is a 28 y.o. G5P4004 at [redacted]w[redacted]d being seen today for ongoing prenatal care.  She is currently monitored for the following issues for this low-risk pregnancy and has Supervision of other normal pregnancy, antepartum; Late prenatal care; and History of oligohydramnios on their problem list.  Patient reports no complaints.  Contractions: Not present. Vag. Bleeding: None.  Movement: Present. Denies leaking of fluid.   The following portions of the patient's history were reviewed and updated as appropriate: allergies, current medications, past family history, past medical history, past social history, past surgical history and problem list. Problem list updated.  Objective:   Vitals:   03/20/18 1111  BP: 114/74  Pulse: 72  Weight: 204 lb 9.6 oz (92.8 kg)    Fetal Status: Fetal Heart Rate (bpm): 155 Fundal Height: 38 cm Movement: Present  Presentation: Vertex  General:  Alert, oriented and cooperative. Patient is in no acute distress.  Skin: Skin is warm and dry. No rash noted.   Cardiovascular: Normal heart rate noted  Respiratory: Normal respiratory effort, no problems with respiration noted  Abdomen: Soft, gravid, appropriate for gestational age.  Pain/Pressure: Present     Pelvic: Cervical exam deferred        Extremities: Normal range of motion.  Edema: None  Mental Status: Normal mood and affect. Normal behavior. Normal judgment and thought content.   Assessment and Plan:  Pregnancy: G5P4004 at [redacted]w[redacted]d  1. Supervision of other normal pregnancy, antepartum Declines cervical exam today Discussed sweeping membranes at 39wks if desired Patient asked today about possible IOL. Discussed briefly ARRIVE Trial data and evidence that waiting until 41 wk for IOL is safe for her based on her low risk pregnancy.   2. Late prenatal care Up to date  3. History of oligohydramnios FH is appropriate today, MVP 4.79cm  Term labor symptoms and  general obstetric precautions including but not limited to vaginal bleeding, contractions, leaking of fluid and fetal movement were reviewed in detail with the patient. Please refer to After Visit Summary for other counseling recommendations.  No follow-ups on file.  Future Appointments  Date Time Provider Department Center  03/27/2018 11:00 AM Calvert Cantor, [place] [facility]    Federico Flake, MD

## 2018-03-27 ENCOUNTER — Telehealth (HOSPITAL_COMMUNITY): Payer: Self-pay | Admitting: *Deleted

## 2018-03-27 ENCOUNTER — Other Ambulatory Visit: Payer: Self-pay | Admitting: Advanced Practice Midwife

## 2018-03-27 ENCOUNTER — Ambulatory Visit (INDEPENDENT_AMBULATORY_CARE_PROVIDER_SITE_OTHER): Payer: Medicaid Other | Admitting: Advanced Practice Midwife

## 2018-03-27 VITALS — BP 128/81 | HR 99 | Wt 207.0 lb

## 2018-03-27 DIAGNOSIS — Z3480 Encounter for supervision of other normal pregnancy, unspecified trimester: Secondary | ICD-10-CM

## 2018-03-27 DIAGNOSIS — Z8759 Personal history of other complications of pregnancy, childbirth and the puerperium: Secondary | ICD-10-CM

## 2018-03-27 DIAGNOSIS — Z3483 Encounter for supervision of other normal pregnancy, third trimester: Secondary | ICD-10-CM

## 2018-03-27 NOTE — Progress Notes (Signed)
Orders for PDIOL 04/10/2018 at 0730. Patient will be 41.0 GA. Cervical exam 01/50/-2 on 03/27/2018. Based on this exam, plan for foley bulb with Cytotec.  Clayton Bibles, CNM 03/27/18 11:27 AM

## 2018-03-27 NOTE — Patient Instructions (Signed)
Labor Induction    Labor induction is when steps are taken to cause a pregnant woman to begin the labor process. Most women go into labor on their own between 37 weeks and 42 weeks of pregnancy. When this does not happen or when there is a medical need for labor to begin, steps may be taken to induce labor. Labor induction causes a pregnant woman's uterus to contract. It also causes the cervix to soften (ripen), open (dilate), and thin out (efface). Usually, labor is not induced before 39 weeks of pregnancy unless there is a medical reason to do so. Your health care provider will determine if labor induction is needed.  Before inducing labor, your health care provider will consider a number of factors, including:  · Your medical condition and your baby's.  · How many weeks along you are in your pregnancy.  · How mature your baby's lungs are.  · The condition of your cervix.  · The position of your baby.  · The size of your birth canal.  What are some reasons for labor induction?  Labor may be induced if:  · Your health or your baby's health is at risk.  · Your pregnancy is overdue by 1 week or more.  · Your water breaks but labor does not start on its own.  · There is a low amount of amniotic fluid around your baby.  You may also choose (elect) to have labor induced at a certain time. Generally, elective labor induction is done no earlier than 39 weeks of pregnancy.  What methods are used for labor induction?  Methods used for labor induction include:  · Prostaglandin medicine. This medicine starts contractions and causes the cervix to dilate and ripen. It can be taken by mouth (orally) or by being inserted into the vagina (suppository).  · Inserting a small, thin tube (catheter) with a balloon into the vagina and then expanding the balloon with water to dilate the cervix.  · Stripping the membranes. In this method, your health care provider gently separates amniotic sac tissue from the cervix. This causes the  cervix to stretch, which in turn causes the release of a hormone called progesterone. The hormone causes the uterus to contract. This procedure is often done during an office visit, after which you will be sent home to wait for contractions to begin.  · Breaking the water. In this method, your health care provider uses a small instrument to make a small hole in the amniotic sac. This eventually causes the amniotic sac to break. Contractions should begin after a few hours.  · Medicine to trigger or strengthen contractions. This medicine is given through an IV that is inserted into a vein in your arm.  Except for membrane stripping, which can be done in a clinic, labor induction is done in the hospital so that you and your baby can be carefully monitored.  How long does it take for labor to be induced?  The length of time it takes to induce labor depends on how ready your body is for labor. Some inductions can take up to 2-3 days, while others may take less than a day. Induction may take longer if:  · You are induced early in your pregnancy.  · It is your first pregnancy.  · Your cervix is not ready.  What are some risks associated with labor induction?  Some risks associated with labor induction include:  · Changes in fetal heart rate, such as being too   high, too low, or irregular (erratic).  · Failed induction.  · Infection in the mother or the baby.  · Increased risk of having a cesarean delivery.  · Fetal death.  · Breaking off (abruption) of the placenta from the uterus (rare).  · Rupture of the uterus (very rare).  When induction is needed for medical reasons, the benefits of induction generally outweigh the risks.  What are some reasons for not inducing labor?  Labor induction should not be done if:  · Your baby does not tolerate contractions.  · You have had previous surgeries on your uterus, such as a myomectomy, removal of fibroids, or a vertical scar from a previous cesarean delivery.  · Your placenta lies  very low in your uterus and blocks the opening of the cervix (placenta previa).  · Your baby is not in a head-down position.  · The umbilical cord drops down into the birth canal in front of the baby.  · There are unusual circumstances, such as the baby being very early (premature).  · You have had more than 2 previous cesarean deliveries.  Summary  · Labor induction is when steps are taken to cause a pregnant woman to begin the labor process.  · Labor induction causes a pregnant woman's uterus to contract. It also causes the cervix to ripen, dilate, and efface.  · Labor is not induced before 39 weeks of pregnancy unless there is a medical reason to do so.  · When induction is needed for medical reasons, the benefits of induction generally outweigh the risks.  This information is not intended to replace advice given to you by your health care provider. Make sure you discuss any questions you have with your health care provider.  Document Released: 06/28/2006 Document Revised: 03/22/2016 Document Reviewed: 03/22/2016  Elsevier Interactive Patient Education © 2019 [employer]

## 2018-03-27 NOTE — Telephone Encounter (Signed)
Preadmission screen  

## 2018-03-27 NOTE — Progress Notes (Signed)
   PRENATAL VISIT NOTE  Subjective:  Teresa Horn is a 28 y.o. G5P4004 at 15102w0d being seen today for ongoing prenatal care.  She is currently monitored for the following issues for this low-risk pregnancy and has Supervision of other normal pregnancy, antepartum; Late prenatal care; and History of oligohydramnios on their problem list.  Patient reports occasional contractions.  Contractions: Irregular. Vag. Bleeding: None.  Movement: Present. Denies leaking of fluid.   The following portions of the patient's history were reviewed and updated as appropriate: allergies, current medications, past family history, past medical history, past social history, past surgical history and problem list. Problem list updated.  Objective:   Vitals:   03/27/18 1102  BP: 128/81  Pulse: 99  Weight: 207 lb (93.9 kg)    Fetal Status: Fetal Heart Rate (bpm): 147 Fundal Height: 39 cm Movement: Present  Presentation: Vertex  General:  Alert, oriented and cooperative. Patient is in no acute distress.  Skin: Skin is warm and dry. No rash noted.   Cardiovascular: Normal heart rate noted  Respiratory: Normal respiratory effort, no problems with respiration noted  Abdomen: Soft, gravid, appropriate for gestational age.  Pain/Pressure: Present     Pelvic: Cervical exam performed Dilation: 1 Effacement (%): Thick Station: -2  Extremities: Normal range of motion.  Edema: None  Mental Status: Normal mood and affect. Normal behavior. Normal judgment and thought content.   Assessment and Plan:  Pregnancy: G5P4004 at 75102w0d  1. Supervision of other normal pregnancy, antepartum --Membranes swept today. Patient made aware of risks of contractions without dilation as well as SROM prior to sweep. Tolerated well by patient --PDIOL scheduled for 0730 on 04/10/2018 at 41w 0d. Plan for foley bulb + Cytotec if still 1/thick/-2  2. History of oligohydramnios --FH normal  Term labor symptoms and general obstetric  precautions including but not limited to vaginal bleeding, contractions, leaking of fluid and fetal movement were reviewed in detail with the patient. Please refer to After Visit Summary for other counseling recommendations.  Return in about 1 week (around 04/03/2018).  Future Appointments  Date Time Provider Department Center  04/03/2018  9:45 AM Federico FlakeNewton, Kimberly Niles, MD [facility]  04/10/2018  7:30 AM [facility] None    Calvert CantorSamantha C Shayaan Parke, CNM

## 2018-04-03 ENCOUNTER — Encounter: Payer: Self-pay | Admitting: Family Medicine

## 2018-04-03 ENCOUNTER — Ambulatory Visit (INDEPENDENT_AMBULATORY_CARE_PROVIDER_SITE_OTHER): Payer: Medicaid Other | Admitting: Family Medicine

## 2018-04-03 ENCOUNTER — Encounter: Payer: Self-pay | Admitting: Radiology

## 2018-04-03 VITALS — BP 114/73 | HR 82 | Wt 208.0 lb

## 2018-04-03 DIAGNOSIS — O0930 Supervision of pregnancy with insufficient antenatal care, unspecified trimester: Secondary | ICD-10-CM

## 2018-04-03 DIAGNOSIS — Z3483 Encounter for supervision of other normal pregnancy, third trimester: Secondary | ICD-10-CM

## 2018-04-03 DIAGNOSIS — Z3A40 40 weeks gestation of pregnancy: Secondary | ICD-10-CM

## 2018-04-03 DIAGNOSIS — Z3480 Encounter for supervision of other normal pregnancy, unspecified trimester: Secondary | ICD-10-CM

## 2018-04-03 DIAGNOSIS — Z8759 Personal history of other complications of pregnancy, childbirth and the puerperium: Secondary | ICD-10-CM

## 2018-04-03 DIAGNOSIS — O0933 Supervision of pregnancy with insufficient antenatal care, third trimester: Secondary | ICD-10-CM

## 2018-04-03 NOTE — Progress Notes (Signed)
   PRENATAL VISIT NOTE  Subjective:  Teresa Horn is a 28 y.o. G5P4004 at [redacted]w[redacted]d being seen today for ongoing prenatal care.  She is currently monitored for the following issues for this low-risk pregnancy and has Supervision of other normal pregnancy, antepartum; Late prenatal care; and History of oligohydramnios on their problem list.  Patient reports contractions since last week, intermittent in nature, go away when she sits down. .  Contractions: Irregular. Vag. Bleeding: None.  Movement: Present. Denies leaking of fluid.   The following portions of the patient's history were reviewed and updated as appropriate: allergies, current medications, past family history, past medical history, past social history, past surgical history and problem list. Problem list updated.  Objective:   Vitals:   04/03/18 0941  BP: 114/73  Pulse: 82  Weight: 208 lb (94.3 kg)    Fetal Status: Fetal Heart Rate (bpm): 145 Fundal Height: 40 cm Movement: Present  Presentation: Vertex  General:  Alert, oriented and cooperative. Patient is in no acute distress.  Skin: Skin is warm and dry. No rash noted.   Cardiovascular: Normal heart rate noted  Respiratory: Normal respiratory effort, no problems with respiration noted  Abdomen: Soft, gravid, appropriate for gestational age.  Pain/Pressure: Present     Pelvic: Cervical exam deferred Dilation: 3 Effacement (%): 50 Station: -2  Extremities: Normal range of motion.  Edema: None  Mental Status: Normal mood and affect. Normal behavior. Normal judgment and thought content.   Assessment and Plan:  Pregnancy: G5P4004 at [redacted]w[redacted]d  1. Supervision of other normal pregnancy, antepartum Swept last week, desires again today. Discussed repeat sweeping for the next 3d Discussed nipple stimulation, walking, sex and given information about [employer] Recommended repeat sweeping in 1-2 d  2. Late prenatal care Up to date  3. History of oligohydramnios Last MVP WNL.  FH appropriate today  Preterm labor symptoms and general obstetric precautions including but not limited to vaginal bleeding, contractions, leaking of fluid and fetal movement were reviewed in detail with the patient. Please refer to After Visit Summary for other counseling recommendations.  Return in about 1 day (around 04/04/2018) for Membrane sweeping.  Future Appointments  Date Time Provider Department Center  04/10/2018  7:30 AM [facility] None    Federico Flake, MD

## 2018-04-04 ENCOUNTER — Inpatient Hospital Stay (HOSPITAL_COMMUNITY)
Admission: AD | Admit: 2018-04-04 | Discharge: 2018-04-06 | DRG: 807 | Disposition: A | Discharge status: Home / Self Care | Payer: Medicaid Other | Attending: Obstetrics and Gynecology | Admitting: Obstetrics and Gynecology

## 2018-04-04 ENCOUNTER — Encounter (HOSPITAL_COMMUNITY): Payer: Self-pay

## 2018-04-04 ENCOUNTER — Encounter: Payer: Self-pay | Admitting: Family Medicine

## 2018-04-04 DIAGNOSIS — O479 False labor, unspecified: Secondary | ICD-10-CM | POA: Diagnosis present

## 2018-04-04 DIAGNOSIS — Z3A40 40 weeks gestation of pregnancy: Secondary | ICD-10-CM

## 2018-04-04 DIAGNOSIS — O480 Post-term pregnancy: Secondary | ICD-10-CM

## 2018-04-04 DIAGNOSIS — Z8759 Personal history of other complications of pregnancy, childbirth and the puerperium: Secondary | ICD-10-CM

## 2018-04-04 DIAGNOSIS — O0930 Supervision of pregnancy with insufficient antenatal care, unspecified trimester: Secondary | ICD-10-CM

## 2018-04-04 MED ORDER — OXYTOCIN 40 UNITS IN NORMAL SALINE INFUSION - SIMPLE MED
INTRAVENOUS | Status: AC
  Administered 2018-04-04: 500 mL via INTRAVENOUS
  Filled 2018-04-04: qty 1000

## 2018-04-04 NOTE — H&P (Signed)
Teresa Horn is a 28 y.o. female presenting for painful contractions since 10pm Arrives by EMS with urge to push Preparations were made for delivery.     Level 5 Caveat applies due to imminent delivery  Patient Active Problem List   Diagnosis Date Noted  . Uterine contractions during pregnancy 04/05/2018  . Postpartum care following vaginal delivery 04/05/2018  . Supervision of other normal pregnancy, antepartum 11/27/2017  . Late prenatal care 11/27/2017  . History of oligohydramnios 11/27/2017    . OB History    Gravida  5   Para  4   Term  4   Preterm      AB      Living  4     SAB      TAB      Ectopic      Multiple      Live Births  4        Obstetric Comments  All vaginal. Largest was 9lbs 12oz with G4 pregnancy in 2017       No past medical history on file. Past Surgical History:  Procedure Laterality Date  . IUD REMOVAL     from right pelvis   Family History: family history is not on file. Social History:  has no history on file for tobacco, alcohol, and drug.     Review of Systems  Constitutional: Negative for chills, fever and malaise/fatigue.  Respiratory: Negative for shortness of breath.   Gastrointestinal: Positive for abdominal pain. Negative for constipation, diarrhea, nausea and vomiting.  Genitourinary: Negative for dysuria.  Musculoskeletal: Positive for back pain.   Maternal Medical History:  Reason for admission: Contractions.  Nausea.  Contractions: Onset was 1-2 hours ago.   Frequency: regular.   Perceived severity is strong.    Fetal activity: Perceived fetal activity is normal.   Last perceived fetal movement was within the past hour.    Prenatal complications: No bleeding, PIH, pre-eclampsia or preterm labor.   Prenatal Complications - Diabetes: none.      Last menstrual period 06/27/2017. Maternal Exam:  Uterine Assessment: Contraction strength is firm.  Contraction frequency is regular.   Abdomen:  Patient reports no abdominal tenderness. Fetal presentation: vertex  Introitus: Normal vulva. Normal vagina.  Ferning test: not done.  Nitrazine test: not done. Amniotic fluid character: not assessed.  Cervix: Cervix evaluated by digital exam.     Fetal Exam Fetal Monitor Review: Mode: ultrasound.   Baseline rate: 140.  Variability: moderate (6-25 bpm).   Unable to get FHR tracing due to imminent delivery  Fetal State Assessment: Category I - tracings are normal.     Physical Exam  Constitutional: She is oriented to person, place, and time. She appears well-developed and well-nourished. She appears distressed.  HENT:  Head: Normocephalic.  Cardiovascular: Normal rate and regular rhythm.  Respiratory: Effort normal. No respiratory distress.  GI: Soft. She exhibits no distension. There is no abdominal tenderness. There is no rebound and no guarding.  Genitourinary:    Vulva normal.     Genitourinary Comments: Cervix 10/100/+2+3/vertex    Musculoskeletal: Normal range of motion.  Neurological: She is alert and oriented to person, place, and time.  Skin: Skin is warm and dry.  Psychiatric: She has a normal mood and affect.    Prenatal labs: ABO, Rh: A/Positive/-- (11/19 0900) Antibody: Negative (11/19 0900) Rubella: 1.36 (11/19 0900) RPR: Non Reactive (11/19 0900)  HBsAg: Negative (11/19 0900)  HIV: Non Reactive (11/19 0900)  GBS:  Assessment/Plan: Single intrauterine pregnancy at [redacted]w[redacted]d Active Labor, second stage  Admit  Routine orders Prepare for delivery in MAU   Wynelle Bourgeois 04/04/2018, 11:21 PM

## 2018-04-05 ENCOUNTER — Encounter: Payer: Self-pay | Admitting: Family Medicine

## 2018-04-05 ENCOUNTER — Encounter (HOSPITAL_COMMUNITY): Payer: Self-pay

## 2018-04-05 DIAGNOSIS — Z3483 Encounter for supervision of other normal pregnancy, third trimester: Secondary | ICD-10-CM | POA: Diagnosis present

## 2018-04-05 DIAGNOSIS — O479 False labor, unspecified: Secondary | ICD-10-CM | POA: Diagnosis present

## 2018-04-05 DIAGNOSIS — Z3A40 40 weeks gestation of pregnancy: Secondary | ICD-10-CM | POA: Diagnosis not present

## 2018-04-05 HISTORY — DX: False labor, unspecified: O47.9

## 2018-04-05 LAB — TYPE AND SCREEN
ABO/RH(D): A POS
Antibody Screen: NEGATIVE

## 2018-04-05 LAB — CBC
HCT: 30.9 % — ABNORMAL LOW (ref 36.0–46.0)
Hemoglobin: 10.2 g/dL — ABNORMAL LOW (ref 12.0–15.0)
MCH: 26.6 pg (ref 26.0–34.0)
MCHC: 33 g/dL (ref 30.0–36.0)
MCV: 80.5 fL (ref 80.0–100.0)
Platelets: 239 10*3/uL (ref 150–400)
RBC: 3.84 MIL/uL — ABNORMAL LOW (ref 3.87–5.11)
RDW: 13.2 % (ref 11.5–15.5)
WBC: 16.5 10*3/uL — ABNORMAL HIGH (ref 4.0–10.5)

## 2018-04-05 LAB — RPR: RPR Ser Ql: NONREACTIVE

## 2018-04-05 LAB — ABO/RH: ABO/RH(D): A POS

## 2018-04-05 MED ORDER — SOD CITRATE-CITRIC ACID 500-334 MG/5ML PO SOLN: 30.0000 mL | ORAL | Status: DC | PRN

## 2018-04-05 MED ORDER — LACTATED RINGERS IV SOLN
INTRAVENOUS | Status: DC
  Administered 2018-04-05: 01:00:00 via INTRAVENOUS

## 2018-04-05 MED ORDER — ACETAMINOPHEN 325 MG PO TABS
650.0000 mg | ORAL_TABLET | ORAL | Status: DC | PRN
  Filled 2018-04-05: qty 2

## 2018-04-05 MED ORDER — ONDANSETRON HCL 4 MG PO TABS: 4.0000 mg | ORAL_TABLET | ORAL | Status: DC | PRN

## 2018-04-05 MED ORDER — TETANUS-DIPHTH-ACELL PERTUSSIS 5-2.5-18.5 LF-MCG/0.5 IM SUSP: 0.5000 mL | Freq: Once | INTRAMUSCULAR | Status: DC

## 2018-04-05 MED ORDER — WITCH HAZEL-GLYCERIN EX PADS: 1.0000 "application " | MEDICATED_PAD | CUTANEOUS | Status: DC | PRN

## 2018-04-05 MED ORDER — ZOLPIDEM TARTRATE 5 MG PO TABS: 5.0000 mg | ORAL_TABLET | Freq: Every evening | ORAL | Status: DC | PRN

## 2018-04-05 MED ORDER — OXYTOCIN 40 UNITS IN NORMAL SALINE INFUSION - SIMPLE MED: 2.5000 [IU]/h | INTRAVENOUS | Status: DC

## 2018-04-05 MED ORDER — DIPHENHYDRAMINE HCL 25 MG PO CAPS: 25.0000 mg | ORAL_CAPSULE | Freq: Four times a day (QID) | ORAL | Status: DC | PRN

## 2018-04-05 MED ORDER — BENZOCAINE-MENTHOL 20-0.5 % EX AERO
1.0000 "application " | INHALATION_SPRAY | CUTANEOUS | Status: DC | PRN
  Administered 2018-04-05: 1 via TOPICAL
  Filled 2018-04-05: qty 56

## 2018-04-05 MED ORDER — LIDOCAINE HCL (PF) 1 % IJ SOLN
30.0000 mL | INTRAMUSCULAR | Status: DC | PRN
  Filled 2018-04-05: qty 30

## 2018-04-05 MED ORDER — SENNOSIDES-DOCUSATE SODIUM 8.6-50 MG PO TABS
2.0000 | ORAL_TABLET | ORAL | Status: DC
  Administered 2018-04-05 – 2018-04-06 (×2): 2 via ORAL
  Filled 2018-04-05: qty 2

## 2018-04-05 MED ORDER — COCONUT OIL OIL: 1.0000 "application " | TOPICAL_OIL | Status: DC | PRN

## 2018-04-05 MED ORDER — IBUPROFEN 600 MG PO TABS
600.0000 mg | ORAL_TABLET | Freq: Four times a day (QID) | ORAL | Status: DC
  Administered 2018-04-05 – 2018-04-06 (×7): 600 mg via ORAL
  Filled 2018-04-05 (×7): qty 1

## 2018-04-05 MED ORDER — OXYTOCIN BOLUS FROM INFUSION
500.0000 mL | Freq: Once | INTRAVENOUS | Status: AC
  Administered 2018-04-04: 500 mL via INTRAVENOUS

## 2018-04-05 MED ORDER — SIMETHICONE 80 MG PO CHEW: 80.0000 mg | CHEWABLE_TABLET | ORAL | Status: DC | PRN

## 2018-04-05 MED ORDER — ONDANSETRON HCL 4 MG/2ML IJ SOLN: 4.0000 mg | Freq: Four times a day (QID) | INTRAMUSCULAR | Status: DC | PRN

## 2018-04-05 MED ORDER — LACTATED RINGERS IV SOLN: 500.0000 mL | INTRAVENOUS | Status: DC | PRN

## 2018-04-05 MED ORDER — ONDANSETRON HCL 4 MG/2ML IJ SOLN: 4.0000 mg | INTRAMUSCULAR | Status: DC | PRN

## 2018-04-05 MED ORDER — PRENATAL MULTIVITAMIN CH
1.0000 | ORAL_TABLET | Freq: Every day | ORAL | Status: DC
  Administered 2018-04-05 – 2018-04-06 (×2): 1 via ORAL
  Filled 2018-04-05 (×2): qty 1

## 2018-04-05 MED ORDER — DIBUCAINE 1 % RE OINT: 1.0000 "application " | TOPICAL_OINTMENT | RECTAL | Status: DC | PRN

## 2018-04-05 MED ORDER — ACETAMINOPHEN 325 MG PO TABS
650.0000 mg | ORAL_TABLET | ORAL | Status: DC | PRN
  Administered 2018-04-05: 650 mg via ORAL

## 2018-04-05 NOTE — Lactation Note (Signed)
This note was copied from a baby's chart. Lactation Consultation Note  Patient Name: Teresa Horn Date: 04/05/2018 Reason for consult: Initial assessment;Term P5, 4 hour female infant. Mom is experience at breastfeeding she BF her other children for 3 weeks, 1 month, 11 months and 68 months of age. Mom feels BF is going well and she BF 20 minutes prior to [facility] entering the room. LC did not observe a latch at this time. Per mom, she received WIC in [facility].  LC discussed I & O. Reviewed Baby & Me book's Breastfeeding Basics.  Mom knows to call Nurse or LC if she has any questions, concerns or need assistance with latching infant to breast. Mom made aware of O/P services, breastfeeding support groups, community resources, and our phone # for post-discharge questions.   Maternal Data Formula Feeding for Exclusion: No Has patient been taught Hand Expression?: Yes Does the patient have breastfeeding experience prior to this delivery?: Yes  Feeding Feeding Type: Breast Milk  LATCH Score Latch: Grasps breast easily, tongue down, lips flanged, rhythmical sucking.  Audible Swallowing: A few with stimulation  Type of Nipple: Everted at rest and after stimulation  Comfort (Breast/Nipple): Soft / non-tender  Hold (Positioning): No assistance needed to correctly position infant at breast.  LATCH Score: 9  Interventions Interventions: Breast feeding basics reviewed;Hand express  Lactation Tools Discussed/Used WIC Program: Yes   Consult Status Consult Status: Follow-up Date: 04/05/18 Follow-up type: In-patient    Teresa Horn 04/05/2018, 3:07 AM

## 2018-04-05 NOTE — Progress Notes (Addendum)
Post Partum Day 1 Subjective: no complaints, up ad lib, voiding, tolerating PO and + flatus  Objective: Blood pressure 105/64, pulse 91, temperature 98.4 F (36.9 C), temperature source Oral, resp. rate 16, last menstrual period 06/27/2017, SpO2 99 %, unknown if currently breastfeeding.  Physical Exam:  General: alert, cooperative and no distress Lochia: appropriate Uterine Fundus: firm Incision: n/a DVT Evaluation: No evidence of DVT seen on physical exam. No cords or calf tenderness. No significant calf/ankle edema.  Recent Labs    04/05/18 0048  HGB 10.2*  HCT 30.9*    Assessment/Plan: Plan for discharge tomorrow and Breastfeeding   LOS: 1 day   Standley Horn 04/05/2018, 9:21 AM   CNM attestation Post Partum Day #1 I have seen and examined this patient and agree with above documentation in the resident's note.   Teresa Horn is a 28 y.o. [id number] s/p SVD.  Pt denies problems with ambulating, voiding or po intake. Pain is well controlled.  Plan for birth control is undecided.  Method of Feeding: breast  PE:  BP 111/71 (BP Location: Right Arm)   Pulse 79   Temp 98.4 F (36.9 C) (Oral)   Resp 18   LMP 06/27/2017   SpO2 99%   Breastfeeding Unknown  Fundus firm  Plan for discharge: 04/06/18  Teresa Horn, CNM 12:15 PM  04/05/2018

## 2018-04-06 MED ORDER — SENNOSIDES-DOCUSATE SODIUM 8.6-50 MG PO TABS: 1.0000 | ORAL_TABLET | ORAL | 0 refills | Status: DC

## 2018-04-06 MED ORDER — IBUPROFEN 600 MG PO TABS: 600.0000 mg | ORAL_TABLET | Freq: Four times a day (QID) | ORAL | 0 refills | Status: DC

## 2018-04-06 NOTE — Lactation Note (Signed)
This note was copied from a baby's chart. Lactation Consultation Note  Patient Name: Teresa Horn [id number] Date: 04/06/2018 Reason for consult: Follow-up assessment Baby is 37 hours old/6% weight loss.  Mom states baby has been hungry after feeds so she started formula supplementation.  She has a history of a low milk supply and needed to use some formula with previous babies.  Instructed to always put baby to breast first and allow baby to feed on both sides. Lactation outpatient services and support reviewed and encouraged prn.  Maternal Data    Feeding Feeding Type: Bottle Fed - Formula  LATCH Score                   Interventions    Lactation Tools Discussed/Used     Consult Status Consult Status: Complete Follow-up type: Call as needed    Huston Foley 04/06/2018, 11:35 AM

## 2018-04-06 NOTE — Discharge Summary (Addendum)
OB Discharge Summary     Patient Name: Teresa Horn DOB: November 28, 1990 MRN: [id number]  Date of admission: 04/04/2018 Delivering MD: Aviva Signs   Date of discharge: 04/06/2018  Admitting diagnosis: EMS LABOR Intrauterine pregnancy: [redacted]w[redacted]d     Secondary diagnosis:  Active Problems:   Late prenatal care   History of oligohydramnios   Uterine contractions during pregnancy   Postpartum care following vaginal delivery  Additional problems: None     Discharge diagnosis: Term Pregnancy Delivered                                                                                                Post partum procedures:None  Augmentation: None  Complications: None  Hospital course:  Onset of Labor With Vaginal Delivery     28 y.o. yo G5P5005 at [redacted]w[redacted]d was admitted in Active Labor on 04/04/2018. Patient had an uncomplicated, precipitous labor course as follows:  Membrane Rupture Time/Date: 8:39 PM ,04/04/2018   Intrapartum Procedures: Episiotomy: None [1]                                         Lacerations:  1st degree [2];Perineal [11]  Patient had a delivery of a Viable infant. 04/04/2018  Information for the patient's newborn:  Eriqa, Temby [[id number]]  Delivery Method: Vaginal, Spontaneous(Filed from Delivery Summary)   Patient had an uncomplicated postpartum course.  She is ambulating, tolerating a regular diet, passing flatus, and urinating well. Patient is discharged home in stable condition on 04/06/18.  Physical exam  Vitals:   04/05/18 0900 04/05/18 1315 04/05/18 2245 04/06/18 0603  BP: 111/71 90/69 108/69 103/63  Pulse: 79 81 79 75  Resp: 18 18 18 18   Temp: 98.4 F (36.9 C) 97.9 F (36.6 C) 97.8 F (36.6 C) 97.8 F (36.6 C)  TempSrc: Oral Oral Oral Oral  SpO2: 99% 100%  100%   General: alert, cooperative and no distress Lochia: appropriate Uterine Fundus: firm Incision: N/A DVT Evaluation: No evidence of DVT seen on physical exam. No significant  calf/ankle edema. Labs: Lab Results  Component Value Date   WBC 16.5 (H) 04/05/2018   HGB 10.2 (L) 04/05/2018   HCT 30.9 (L) 04/05/2018   MCV 80.5 04/05/2018   PLT 239 04/05/2018   No flowsheet data found.  Discharge instruction: per After Visit Summary and "Baby and Me Booklet".  After visit meds:  Allergies as of 04/06/2018   No Known Allergies     Medication List    TAKE these medications   ibuprofen 600 MG tablet Commonly known as:  ADVIL,MOTRIN Take 1 tablet (600 mg total) by mouth every 6 (six) hours.   PRENATAL VITAMINS PO Take by mouth.   senna-docusate 8.6-50 MG tablet Commonly known as:  Senokot-S Take 1 tablet by mouth daily. Start taking on:  April 07, 2018       Diet: routine diet  Activity: Advance as tolerated. Pelvic rest for 6 weeks.   Outpatient follow up:4 weeks Follow up Appt:No  future appointments. Follow up Visit:No follow-ups on file.  Postpartum contraception: Undecided  Newborn Data: Live born female  Birth Weight: 9 lb 2 oz (4140 g) APGAR: 8, 9  Newborn Delivery   Birth date/time:  04/04/2018 22:31:00 Delivery type:  Vaginal, Spontaneous     Baby Feeding: Breast Disposition:home with mother   04/06/2018 Dollene Cleveland, DO  CNM attestation I have seen and examined this patient and agree with above documentation in the resident's note.   Teresa Horn is a 28 y.o. [id number] s/p SVD.   Pain is well controlled.  Plan for birth control is undecided.  Method of Feeding: breast  PE:  BP 103/63 (BP Location: Left Arm)   Pulse 75   Temp 97.8 F (36.6 C) (Oral)   Resp 18   LMP 06/27/2017   SpO2 100%   Breastfeeding Unknown  Fundus firm  No results for input(s): HGB, HCT in the last 72 hours.   Plan: discharge today - postpartum care discussed - f/u clinic in 4 weeks for postpartum visit   [name], CNM 1:46 AM

## 2018-04-06 NOTE — Discharge Instructions (Signed)
Vaginal Delivery, Care After °Refer to this sheet in the next few weeks. These instructions provide you with information about caring for yourself after vaginal delivery. Your health care provider may also give you more specific instructions. Your treatment has been planned according to current medical practices, but problems sometimes occur. Call your health care provider if you have any problems or questions. °What can I expect after the procedure? °After vaginal delivery, it is common to have: °· Some bleeding from your vagina. °· Soreness in your abdomen, your vagina, and the area of skin between your vaginal opening and your anus (perineum). °· Pelvic cramps. °· Fatigue. °Follow these instructions at home: °Medicines °· Take over-the-counter and prescription medicines only as told by your health care provider. °· If you were prescribed an antibiotic medicine, take it as told by your health care provider. Do not stop taking the antibiotic until it is finished. °Driving ° °· Do not drive or operate heavy machinery while taking prescription pain medicine. °· Do not drive for 24 hours if you received a sedative. °Lifestyle °· Do not drink alcohol. This is especially important if you are breastfeeding or taking medicine to relieve pain. °· Do not use tobacco products, including cigarettes, chewing tobacco, or e-cigarettes. If you need help quitting, ask your health care provider. °Eating and drinking °· Drink at least 8 eight-ounce glasses of water every day unless you are told not to by your health care provider. If you choose to breastfeed your baby, you may need to drink more water than this. °· Eat high-fiber foods every day. These foods may help prevent or relieve constipation. High-fiber foods include: °? Whole grain cereals and breads. °? Brown rice. °? Beans. °? Fresh fruits and vegetables. °Activity °· Return to your normal activities as told by your health care provider. Ask your health care provider what  activities are safe for you. °· Rest as much as possible. Try to rest or take a nap when your baby is sleeping. °· Do not lift anything that is heavier than your baby or 10 lb (4.5 kg) until your health care provider says that it is safe. °· Talk with your health care provider about when you can engage in sexual activity. This may depend on your: °? Risk of infection. °? Rate of healing. °? Comfort and desire to engage in sexual activity. °Vaginal Care °· If you have an episiotomy or a vaginal tear, check the area every day for signs of infection. Check for: °? More redness, swelling, or pain. °? More fluid or blood. °? Warmth. °? Pus or a bad smell. °· Do not use tampons or douches until your health care provider says this is safe. °· Watch for any blood clots that may pass from your vagina. These may look like clumps of dark red, brown, or black discharge. °General instructions °· Keep your perineum clean and dry as told by your health care provider. °· Wear loose, comfortable clothing. °· Wipe from front to back when you use the toilet. °· Ask your health care provider if you can shower or take a bath. If you had an episiotomy or a perineal tear during labor and delivery, your health care provider may tell you not to take baths for a certain length of time. °· Wear a bra that supports your breasts and fits you well. °· If possible, have someone help you with household activities and help care for your baby for at least a few days after you   leave the hospital. °· Keep all follow-up visits for you and your baby as told by your health care provider. This is important. °Contact a health care provider if: °· You have: °? Vaginal discharge that has a bad smell. °? Difficulty urinating. °? Pain when urinating. °? A sudden increase or decrease in the frequency of your bowel movements. °? More redness, swelling, or pain around your episiotomy or vaginal tear. °? More fluid or blood coming from your episiotomy or vaginal  tear. °? Pus or a bad smell coming from your episiotomy or vaginal tear. °? A fever. °? A rash. °? Little or no interest in activities you used to enjoy. °? Questions about caring for yourself or your baby. °· Your episiotomy or vaginal tear feels warm to the touch. °· Your episiotomy or vaginal tear is separating or does not appear to be healing. °· Your breasts are painful, hard, or turn red. °· You feel unusually sad or worried. °· You feel nauseous or you vomit. °· You pass large blood clots from your vagina. If you pass a blood clot from your vagina, save it to show to your health care provider. Do not flush blood clots down the toilet without having your health care provider look at them. °· You urinate more than usual. °· You are dizzy or light-headed. °· You have not breastfed at all and you have not had a menstrual period for 12 weeks after delivery. °· You have stopped breastfeeding and you have not had a menstrual period for 12 weeks after you stopped breastfeeding. °Get help right away if: °· You have: °? Pain that does not go away or does not get better with medicine. °? Chest pain. °? Difficulty breathing. °? Blurred vision or spots in your vision. °? Thoughts about hurting yourself or your baby. °· You develop pain in your abdomen or in one of your legs. °· You develop a severe headache. °· You faint. °· You bleed from your vagina so much that you fill two sanitary pads in one hour. °This information is not intended to replace advice given to you by your health care provider. Make sure you discuss any questions you have with your health care provider. °Document Released: 02/04/2000 Document Revised: 07/21/2015 Document Reviewed: 02/21/2015 °Elsevier Interactive Patient Education © 2019 [employer] ° °

## 2018-04-10 ENCOUNTER — Encounter: Payer: Medicaid Other | Admitting: Advanced Practice Midwife

## 2018-04-10 ENCOUNTER — Inpatient Hospital Stay (HOSPITAL_COMMUNITY): Admission: RE | Admit: 2018-04-10 | Payer: Medicaid Other | Source: Ambulatory Visit

## 2018-05-01 ENCOUNTER — Other Ambulatory Visit: Payer: Self-pay

## 2018-05-01 ENCOUNTER — Encounter: Payer: Self-pay | Admitting: Advanced Practice Midwife

## 2018-05-01 ENCOUNTER — Ambulatory Visit (INDEPENDENT_AMBULATORY_CARE_PROVIDER_SITE_OTHER): Payer: Medicaid Other | Admitting: Advanced Practice Midwife

## 2018-05-01 DIAGNOSIS — Z1389 Encounter for screening for other disorder: Secondary | ICD-10-CM

## 2018-05-01 NOTE — Patient Instructions (Signed)

## 2018-05-01 NOTE — Progress Notes (Signed)
Post Partum Exam  Teresa Horn is a 28 y.o. [id number] female who presents for a postpartum visit. She is 4 weeks postpartum following a spontaneous vaginal delivery. I have fully reviewed the prenatal and intrapartum course. The delivery was at 40.0 gestational weeks.  Anesthesia: none. Postpartum course has been uccomplicated. Baby's course has been uncomplicated. Baby is feeding by both breast and bottle - good start sensitive. Bleeding no bleeding. Bowel function is normal. Bladder function is normal. Patient is not sexually active. Contraception method is condoms. Postpartum depression screening:neg  The following portions of the patient's history were reviewed and updated as appropriate: allergies, current medications, past family history, past medical history, past social history, past surgical history and problem list. Last pap smear done 01/25/2016 and was Normal  Review of Systems A comprehensive review of systems was negative.    Objective:  Blood pressure 109/72, pulse 83, weight 184 lb (83.5 kg), unknown if currently breastfeeding.  General:  alert, cooperative, appears stated age and no distress   Breasts:  inspection negative, no nipple discharge or bleeding, no masses or nodularity palpable  Abdomen: nontender   Vulva:  not evaluated  Vagina: not evaluated  Cervix:  not evaluated         Assessment:   Normal postpartum exam. Depression score of zero. Pap smear not done at today's visit. Due December 2020  Plan:   1. Contraception: plans partner vasectomy 2. Follow up in: 9 months for Pap or as needed.   Clayton Bibles, CNM 05/01/18 1:21 PM

## 2018-08-22 ENCOUNTER — Other Ambulatory Visit: Payer: Self-pay

## 2018-08-22 ENCOUNTER — Encounter: Payer: Self-pay | Admitting: Emergency Medicine

## 2018-08-22 ENCOUNTER — Emergency Department
Admission: EM | Admit: 2018-08-22 | Discharge: 2018-08-22 | Disposition: A | Discharge status: Home / Self Care | Payer: Medicaid Other | Attending: Emergency Medicine | Admitting: Emergency Medicine

## 2018-08-22 DIAGNOSIS — Y929 Unspecified place or not applicable: Secondary | ICD-10-CM | POA: Diagnosis not present

## 2018-08-22 DIAGNOSIS — W208XXA Other cause of strike by thrown, projected or falling object, initial encounter: Secondary | ICD-10-CM | POA: Diagnosis not present

## 2018-08-22 DIAGNOSIS — Y939 Activity, unspecified: Secondary | ICD-10-CM | POA: Diagnosis not present

## 2018-08-22 DIAGNOSIS — S0181XA Laceration without foreign body of other part of head, initial encounter: Secondary | ICD-10-CM | POA: Diagnosis not present

## 2018-08-22 DIAGNOSIS — Y999 Unspecified external cause status: Secondary | ICD-10-CM | POA: Insufficient documentation

## 2018-08-22 MED ORDER — BACITRACIN-NEOMYCIN-POLYMYXIN 400-5-5000 EX OINT
TOPICAL_OINTMENT | Freq: Once | CUTANEOUS | Status: AC
  Administered 2018-08-22: 1 via TOPICAL

## 2018-08-22 MED ORDER — LIDOCAINE-EPINEPHRINE-TETRACAINE (LET) SOLUTION
3.0000 mL | Freq: Once | NASAL | Status: AC
  Administered 2018-08-22: 3 mL via TOPICAL
  Filled 2018-08-22: qty 3

## 2018-08-22 MED ORDER — LIDOCAINE HCL (PF) 1 % IJ SOLN
5.0000 mL | Freq: Once | INTRAMUSCULAR | Status: AC
  Administered 2018-08-22: 11:00:00 5 mL via INTRADERMAL

## 2018-08-22 MED ORDER — LIDOCAINE HCL (PF) 1 % IJ SOLN
INTRAMUSCULAR | Status: AC
  Administered 2018-08-22: 11:00:00 5 mL via INTRADERMAL
  Filled 2018-08-22: qty 5

## 2018-08-22 MED ORDER — BACITRACIN-NEOMYCIN-POLYMYXIN 400-5-5000 EX OINT
TOPICAL_OINTMENT | CUTANEOUS | Status: AC
  Administered 2018-08-22: 1 via TOPICAL
  Filled 2018-08-22: qty 1

## 2018-08-22 NOTE — ED Notes (Signed)
See triage note  States she was working in garden  And tried to help place a pole   Then was hit in forehead  Laceration noted to forehead  No LOC

## 2018-08-22 NOTE — ED Triage Notes (Signed)
laceratin ot forehead

## 2018-08-22 NOTE — Discharge Instructions (Signed)
Do not get the sutured area wet for 24 hours. After 24 hours, shower/bathe as usual and pat the area dry. Change the bandage 2 times per day and apply antibiotic ointment. Leave open to air when at no risk of getting the area dirty, but cover at night before bed. See your PCP or go to Urgent Care in 5 days for suture removal or sooner for signs or concern of infection.  

## 2018-08-22 NOTE — ED Provider Notes (Signed)
[facility] Emergency Department Provider Note  ____________________________________________  Time seen: Approximately 10:23 AM  I have reviewed the triage vital signs and the nursing notes.   HISTORY  Chief Complaint Laceration   HPI Teresa Horn is a 28 y.o. female who presents to the emergency department for treatment and evaluation after accidentally hitting herself in the head with a garden tool. She has a laceration to the forehead. Bleeding well controlled. Tdap not current.  Past Medical History:  Diagnosis Date  . History of oligohydramnios 11/27/2017   With G1 pregnancy.   . Late prenatal care 11/27/2017   NOB @ 20wks  . Supervision of other normal pregnancy, antepartum 11/27/2017    Nursing Staff Provider Office Location  [place] Dating   edc 2/12, lmp=21wk u/s Language   English Anatomy US  Normal Flu Vaccine   declined Genetic Screen  Declined   TDaP vaccine   declined Hgb A1C or  GTT Early  Third trimester 75/84/96 Rhogam  N/a   LAB RESULTS  Feeding Plan  Breast Blood Type A/Positive/-- (10/08 1100) A pos Contraception  undecided Antibody neg Circumcision  N/A [place]  . Uterine contractions during pregnancy 04/05/2018    Patient Active Problem List   Diagnosis Date Noted  . Postpartum care following vaginal delivery 04/05/2018    Past Surgical History:  Procedure Laterality Date  . IUD REMOVAL     from right pelvis    Prior to Admission medications   Not on File    Allergies Patient has no known allergies.  No family history on file.  Social History Social History   Tobacco Use  . Smoking status: Never Smoker  . Smokeless tobacco: Never Used  Substance Use Topics  . Alcohol use: Not on file  . Drug use: Not on file    Review of Systems  Constitutional: Negative for fever. Respiratory: Negative for cough or shortness of breath.  Musculoskeletal: Negative for myalgias Skin: Positive for laceration to  forehead. Neurological: Negative for numbness or paresthesias. ____________________________________________   PHYSICAL EXAM:  VITAL SIGNS: ED Triage Vitals  Enc Vitals Group     BP 08/22/18 1008 118/64     Pulse Rate 08/22/18 1008 73     Resp --      Temp 08/22/18 1008 98.4 F (36.9 C)     Temp Source 08/22/18 1008 Oral     SpO2 08/22/18 1008 100 %     Weight 08/22/18 1008 180 lb (81.6 kg)     Height 08/22/18 1008 5\' 11"  (1.803 m)     Head Circumference --      Peak Flow --      Pain Score 08/22/18 1005 0     Pain Loc --      Pain Edu? --      Excl. in [place]? --      Constitutional: Well appearing. Eyes: Conjunctivae are clear without discharge or drainage. Nose: No rhinorrhea noted. Mouth/Throat: Airway is patent.  Neck: No stridor. Unrestricted range of motion observed. Cardiovascular: Capillary refill is <3 seconds.  Respiratory: Respirations are even and unlabored.. Musculoskeletal: Unrestricted range of motion observed. Neurologic: Awake, alert, and oriented x 4.  Skin: 1cm laceration to right side of forehead.  ____________________________________________   LABS (all labs ordered are listed, but only abnormal results are displayed)  Labs Reviewed - No data to display ____________________________________________  EKG  Not indicated. ____________________________________________  RADIOLOGY  Not indicated. ____________________________________________   PROCEDURES  .Marland KitchenLaceration Repair  Date/Time: 08/22/2018 11:12 AM Performed by: Chinita Pesterriplett, Annastacia Duba B, FNP Authorized by: Chinita Pesterriplett, Arieonna Medine B, FNP   Consent:    Consent obtained:  Verbal   Consent given by:  Patient   Risks discussed:  Poor cosmetic result and poor wound healing Anesthesia (see MAR for exact dosages):    Anesthesia method:  Topical application and local infiltration   Topical anesthetic:  LET   Local anesthetic:  Lidocaine 1% w/o epi Laceration details:    Location:  Face   Face location:   Forehead   Length (cm):  1 Repair type:    Repair type:  Simple Pre-procedure details:    Preparation:  Patient was prepped and draped in usual sterile fashion Exploration:    Hemostasis achieved with:  Direct pressure   Contaminated: no   Treatment:    Area cleansed with:  Betadine and saline   Amount of cleaning:  Standard   Irrigation method:  Tap Skin repair:    Repair method:  Sutures   Suture size:  6-0   Suture material:  Prolene   Number of sutures:  2 Approximation:    Approximation:  Close Post-procedure details:    Dressing:  Antibiotic ointment and sterile dressing   Patient tolerance of procedure:  Tolerated well, no immediate complications   ____________________________________________   INITIAL IMPRESSION / ASSESSMENT AND PLAN / ED COURSE  Teresa Horn is a 28 y.o. female presents with laceration to the forehead. Bleeding is well controlled. Patient refuses Tdap despite being advised of risk of death and increased risk of tetanus since she was injured by metal object.   ----------------------------------------- 11:14 AM on 08/22/2018 ----------------------------------------- Wound repaired as above. Sutures to be removed in 5 days. She is to go to urgent care for removal. She was given wound care instructions and advised to keep the area covered with sunscreen as it will burn easier than the rest of her face. She is to return to the ER for symptoms of concern.    Medications  lidocaine-EPINEPHrine-tetracaine (LET) solution (3 mLs Topical Given 08/22/18 1030)  lidocaine (PF) (XYLOCAINE) 1 % injection 5 mL (5 mLs Intradermal Given 08/22/18 1116)  neomycin-bacitracin-polymyxin (NEOSPORIN) ointment packet (1 application Topical Given 08/22/18 1116)     Pertinent labs & imaging results that were available during my care of the patient were reviewed by me and considered in my medical decision making (see chart for details).   ____________________________________________   FINAL CLINICAL IMPRESSION(S) / ED DIAGNOSES  Final diagnoses:  Laceration of forehead, initial encounter    ED Discharge Orders    None       Note:  This document was prepared using Dragon voice recognition software and may include unintentional dictation errors.   Chinita Pesterriplett, Sakina Briones B, FNP 08/22/18 1356    Jeanmarie PlantMcShane, James A, MD 08/22/18 [phone]

## 2018-12-06 ENCOUNTER — Encounter: Payer: Self-pay | Admitting: Radiology

## 2019-08-21 DIAGNOSIS — Z419 Encounter for procedure for purposes other than remedying health state, unspecified: Secondary | ICD-10-CM | POA: Diagnosis not present

## 2019-09-10 ENCOUNTER — Other Ambulatory Visit: Payer: Self-pay

## 2019-09-10 ENCOUNTER — Encounter: Payer: Self-pay | Admitting: Family Medicine

## 2019-09-10 ENCOUNTER — Ambulatory Visit (INDEPENDENT_AMBULATORY_CARE_PROVIDER_SITE_OTHER): Payer: Medicaid Other | Admitting: Family Medicine

## 2019-09-10 VITALS — BP 104/58 | HR 78 | Temp 98.4°F | Resp 16 | Ht 71.0 in | Wt 178.6 lb

## 2019-09-10 DIAGNOSIS — Z7689 Persons encountering health services in other specified circumstances: Secondary | ICD-10-CM

## 2019-09-10 DIAGNOSIS — B360 Pityriasis versicolor: Secondary | ICD-10-CM | POA: Diagnosis not present

## 2019-09-10 DIAGNOSIS — R252 Cramp and spasm: Secondary | ICD-10-CM

## 2019-09-10 MED ORDER — FLUCONAZOLE 150 MG PO TABS: ORAL_TABLET | ORAL | 3 refills | Status: DC

## 2019-09-10 MED ORDER — CYCLOBENZAPRINE HCL 10 MG PO TABS: 5.0000 mg | ORAL_TABLET | Freq: Three times a day (TID) | ORAL | 0 refills | Status: DC | PRN

## 2019-09-10 NOTE — Assessment & Plan Note (Signed)
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  Reports previous treatment with diflucan 150mg , 2 tablets every 2 weeks with good results when she lived in > 2 years ago.  Requesting to restart on this regimen.  Plan: 1. Begin diflucan 150mg , taking 2 tablets every 2 weeks 2. RTC if symptoms worsen or do not improve with current treatment plan

## 2019-09-10 NOTE — Assessment & Plan Note (Signed)
New patient establishment to [facility] on 09/10/19  Acute concerns for lower back pain and tinea versicolor  Plan: 1. See cramp and spasm A/P and tinea versicolor A/P 2. RTC in 3-6 months for CPE

## 2019-09-10 NOTE — Assessment & Plan Note (Signed)
Bilateral paraspinal cramp/spasm in lumbar spine.  Likely related to muscle strain.  Is active with small children.  Discussed use of muscle relaxant, coupled with ibuprofen and/or acetaminophen, and warm heat.  Plan: 1. Begin cyclobenzaprine 5-10mg  TID PRN as needed for muscle spasms 2. Can use ibuprofen and/or acetaminophen, according to packaging directions, for discomfort 3. Use warm heat/heating pad to the lower back 4. Can use oil and/or lotion and perform tissue massage to help alleviate cramp/spasm 5. RTC in 2-4 weeks if no symptom improvement

## 2019-09-10 NOTE — Progress Notes (Signed)
Subjective:    Patient ID: Teresa Horn, female    DOB: 12-07-1990, 29 y.o.   MRN: [id number]  Teresa Horn is a 29 y.o. female presenting on 09/10/2019 for Establish Care (lower back pain x 3 weeks. Pt state the pain started with her menstrual x 3 weeks ago. ) and Rash (white patches on back. Pt was seen by a dermatologist and treated  w/ diflucan. )   HPI  Previous PCP was in [place].  Records will be requested.  Past medical, family, and surgical history reviewed w/ pt.  Teresa Horn has acute concerns for lower back pain that started approximately 3 weeks ago with onset of her menses.  Denies any injury, trauma, fall, twisting preceding her pain.  Denies numbness, tingling, weakness, loss of bowel/bladder function, saddle anesthesia.  Has tried to use topical lidoderm patches without relief of symptoms.  Has acute concern for tinea versicolor of her upper back.  Reports her previous PCP would send her in diflucan to take 2 tablets every 2 weeks for the summer every year and this had helped her symptoms in the past.  Would like to restart on this.  Depression screen PHQ 2/9 09/10/2019  Decreased Interest 0  Down, Depressed, Hopeless 0  PHQ - 2 Score 0    Social History   Tobacco Use  . Smoking status: Never Smoker  . Smokeless tobacco: Never Used  Vaping Use  . Vaping Use: Never used  Substance Use Topics  . Alcohol use: Never  . Drug use: Never    Review of Systems  Constitutional: Negative.   HENT: Negative.   Eyes: Negative.   Respiratory: Negative.   Cardiovascular: Negative.   Gastrointestinal: Negative.   Endocrine: Negative.   Genitourinary: Negative.   Musculoskeletal: Positive for back pain. Negative for arthralgias, gait problem, joint swelling, myalgias, neck pain and neck stiffness.  Skin: Positive for rash. Negative for color change, pallor and wound.  Allergic/Immunologic: Negative.   Neurological: Negative.   Hematological: Negative.     Psychiatric/Behavioral: Negative.    Per HPI unless specifically indicated above     Objective:    BP (!) 104/58 (BP Location: Left Arm, Patient Position: Sitting, Cuff Size: Normal)   Pulse 78   Temp 98.4 F (36.9 C) (Oral)   Resp 16   Ht 5\' 11"  (1.803 m)   Wt 178 lb 9.6 oz (81 kg)   LMP 08/27/2019   SpO2 100%   Breastfeeding No   BMI 24.91 kg/m   Wt Readings from Last 3 Encounters:  09/10/19 178 lb 9.6 oz (81 kg)  08/22/18 180 lb (81.6 kg)  05/01/18 184 lb (83.5 kg)    Physical Exam Vitals reviewed.  Constitutional:      General: She is not in acute distress.    Appearance: Normal appearance. She is well-developed, well-groomed and normal weight. She is not ill-appearing or toxic-appearing.  HENT:     Head: Normocephalic and atraumatic.     Nose:     Comments: 07/01/18 is in place, covering mouth and nose. Eyes:     General: Lids are normal. Vision grossly intact.        Right eye: No discharge.        Left eye: No discharge.     Extraocular Movements: Extraocular movements intact.     Conjunctiva/sclera: Conjunctivae normal.     Pupils: Pupils are equal, round, and reactive to light.  Cardiovascular:     Pulses: Normal pulses.  Dorsalis pedis pulses are 2+ on the right side and 2+ on the left side.  Pulmonary:     Effort: Pulmonary effort is normal. No respiratory distress.  Musculoskeletal:        General: Tenderness present.     Cervical back: Normal.     Thoracic back: Normal.     Lumbar back: Spasms and tenderness present. No swelling, deformity or lacerations. Normal range of motion. Positive right straight leg raise test and positive left straight leg raise test.       Back:     Right lower leg: No edema.     Left lower leg: No edema.  Skin:    General: Skin is warm and dry.     Capillary Refill: Capillary refill takes less than 2 seconds.     Findings: Rash present.       Neurological:     General: No focal deficit present.     Mental  Status: She is alert and oriented to person, place, and time.  Psychiatric:        Attention and Perception: Attention and perception normal.        Mood and Affect: Mood and affect normal.        Speech: Speech normal.        Behavior: Behavior normal. Behavior is cooperative.        Thought Content: Thought content normal.        Cognition and Memory: Cognition and memory normal.        Judgment: Judgment normal.     Results for orders placed or performed during the hospital encounter of 04/04/18  CBC  Result Value Ref Range   WBC 16.5 (H) 4.0 - 10.5 K/uL   RBC 3.84 (L) 3.87 - 5.11 MIL/uL   Hemoglobin 10.2 (L) 12.0 - 15.0 g/dL   HCT 29.2 (L) 36 - 46 %   MCV 80.5 80.0 - 100.0 fL   MCH 26.6 26.0 - 34.0 pg   MCHC 33.0 30.0 - 36.0 g/dL   RDW 44.6 28.6 - 38.1 %   Platelets 239 150 - 400 K/uL  RPR  Result Value Ref Range   RPR Ser Ql Non Reactive Non Reactive  Type and screen [facility]  Result Value Ref Range   ABO/RH(D) A POS    Antibody Screen NEG    Sample Expiration      04/08/2018 Performed at [facility], [address] [place], [place] [address]   ABO/Rh  Result Value Ref Range   ABO/RH(D)      A POS Performed at [facility], [address] [place], [place] [address]       Assessment & Plan:   Problem List Items Addressed This Visit      Musculoskeletal and Integument   Tinea versicolor    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  Reports previous treatment with diflucan 150mg , 2 tablets every 2 weeks with good results when she lived in > 2 years ago.  Requesting to restart on this regimen.  Plan: 1. Begin diflucan 150mg , taking 2 tablets every 2 weeks 2. RTC if symptoms worsen or do not improve with current treatment plan      Relevant Medications   fluconazole (DIFLUCAN) 150 MG tablet     Other   Encounter to establish care with new doctor    New patient establishment to [facility] on 09/10/19  Acute concerns for  lower back pain and tinea versicolor  Plan:  1. See cramp and spasm A/P and tinea versicolor A/P 2. RTC in 3-6 months for CPE      Cramp and spasm    Bilateral paraspinal cramp/spasm in lumbar spine.  Likely related to muscle strain.  Is active with small children.  Discussed use of muscle relaxant, coupled with ibuprofen and/or acetaminophen, and warm heat.  Plan: 1. Begin cyclobenzaprine 5-10mg  TID PRN as needed for muscle spasms 2. Can use ibuprofen and/or acetaminophen, according to packaging directions, for discomfort 3. Use warm heat/heating pad to the lower back 4. Can use oil and/or lotion and perform tissue massage to help alleviate cramp/spasm 5. RTC in 2-4 weeks if no symptom improvement      Relevant Medications   cyclobenzaprine (FLEXERIL) 10 MG tablet    Other Visit Diagnoses    Establishing care with new doctor, encounter for    -  Primary      Meds ordered this encounter  Medications  . fluconazole (DIFLUCAN) 150 MG tablet    Sig: Take 2 tablets every 2 weeks    Dispense:  4 tablet    Refill:  3  . cyclobenzaprine (FLEXERIL) 10 MG tablet    Sig: Take 0.5-1 tablets (5-10 mg total) by mouth 3 (three) times daily as needed for muscle spasms.    Dispense:  30 tablet    Refill:  0      Follow up plan: Return in about 3 months (around 12/11/2019) for CPE.   Charlaine Dalton, FNP Family Nurse Practitioner [facility] 09/10/2019, 3:08 PM

## 2019-09-10 NOTE — Patient Instructions (Signed)
I have sent in a prescription for diflucan, as you have taken in the past for your upper back tinea versicolor.  Take this as directed.  Start Flexeril 10mg  (muscle relaxant) - use half or whole tablet up to 3 times daily (may make you drowsy)  Start Naproxen (Naprosyn) or OTC Aleve 500mg  twice daily (with food) for 5 to 7 days or may do Ibuprofen / Advil (400mg  every 6 to 8 hours) with food for 5 to 7 days  Increase Tylenol to 1000mg  up to 3 times daily for breakthrough pain for 3-5 days then only as needed  Use moist heat or heating pad on low back, and have family member help with soft tissue massage as demonstrated  Please schedule a follow-up appointment with me in 2 to 4 weeks if symptoms not improving, or sooner if worsening  We will plan to see you back in 3-6 months for physical  You will receive a survey after today's visit either digitally by e-mail or paper by . Your experiences and feedback matter to .  Please respond so we know how we are doing as we provide care for you.  Call with any questions/concerns/needs.  It is my goal to be available to you for your health concerns.  Thanks for choosing me to be a partner in your healthcare needs!  , FNP-C Family Nurse Practitioner [facility] Phone: [phone]

## 2019-09-21 DIAGNOSIS — Z419 Encounter for procedure for purposes other than remedying health state, unspecified: Secondary | ICD-10-CM | POA: Diagnosis not present

## 2019-10-02 ENCOUNTER — Other Ambulatory Visit: Payer: Self-pay

## 2019-10-02 ENCOUNTER — Ambulatory Visit (INDEPENDENT_AMBULATORY_CARE_PROVIDER_SITE_OTHER): Payer: Medicaid Other | Admitting: Family Medicine

## 2019-10-02 ENCOUNTER — Encounter: Payer: Self-pay | Admitting: Family Medicine

## 2019-10-02 DIAGNOSIS — R252 Cramp and spasm: Secondary | ICD-10-CM | POA: Diagnosis not present

## 2019-10-02 MED ORDER — CYCLOBENZAPRINE HCL 10 MG PO TABS: 5.0000 mg | ORAL_TABLET | Freq: Three times a day (TID) | ORAL | 0 refills | Status: AC | PRN

## 2019-10-02 NOTE — Progress Notes (Signed)
Subjective:    Patient ID: Teresa Horn, female    DOB: [birth year], 29 y.o.   MRN: [id number]  Teresa Horn is a 29 y.o. female presenting on 10/02/2019 for Back Pain (constant lower back pain that worse on the left side. Standing and bending makes the pain worse. She describe the pain as a sharp pain. Flexeril helped but did not resolve the pain )   HPI  Patient presents to clinic with concerns of lower back pain, was previously seen on 09/10/2019 in our clinic with concerns of lower back pain x 3 weeks.  Has since then taken ibuprofen, acetaminophen and cyclobenzaprine with minimal relief in symptoms.  Denies any numbness, tingling, weakness, loss of bowel or bladder function, or saddle anesthesia.  Depression screen PHQ 2/9 09/10/2019  Decreased Interest 0  Down, Depressed, Hopeless 0  PHQ - 2 Score 0    Social History   Tobacco Use  . Smoking status: Never Smoker  . Smokeless tobacco: Never Used  Vaping Use  . Vaping Use: Never used  Substance Use Topics  . Alcohol use: Never  . Drug use: Never    Review of Systems  Constitutional: Negative.   HENT: Negative.   Eyes: Negative.   Respiratory: Negative.   Cardiovascular: Negative.   Gastrointestinal: Negative.   Endocrine: Negative.   Genitourinary: Negative.   Musculoskeletal: Positive for back pain and myalgias. Negative for arthralgias, gait problem, joint swelling, neck pain and neck stiffness.  Skin: Negative.   Allergic/Immunologic: Negative.   Neurological: Negative.   Hematological: Negative.   Psychiatric/Behavioral: Negative.    Per HPI unless specifically indicated above     Objective:    BP (!) 100/56 (BP Location: Right Arm, Patient Position: Sitting, Cuff Size: Normal)   Pulse 70   Temp 98.4 F (36.9 C) (Oral)   Ht 5\' 11"  (1.803 m)   Wt 178 lb 3.2 oz (80.8 kg)   BMI 24.85 kg/m   Wt Readings from Last 3 Encounters:  10/02/19 178 lb 3.2 oz (80.8 kg)  09/10/19 178 lb 9.6 oz (81 kg)    08/22/18 180 lb (81.6 kg)    Physical Exam Vitals reviewed.  Constitutional:      General: She is not in acute distress.    Appearance: Normal appearance. She is well-developed, well-groomed and normal weight. She is not ill-appearing or toxic-appearing.  HENT:     Head: Normocephalic and atraumatic.     Nose:     Comments: 10/23/18 is in place, covering mouth and nose. Eyes:     General: Lids are normal. Vision grossly intact.        Right eye: No discharge.        Left eye: No discharge.     Extraocular Movements: Extraocular movements intact.     Conjunctiva/sclera: Conjunctivae normal.     Pupils: Pupils are equal, round, and reactive to light.  Cardiovascular:     Pulses: Normal pulses.  Pulmonary:     Effort: Pulmonary effort is normal. No respiratory distress.  Musculoskeletal:        General: Tenderness present.     Lumbar back: Spasms and tenderness present. Normal range of motion. Negative right straight leg raise test and negative left straight leg raise test.     Right lower leg: No edema.     Left lower leg: No edema.  Skin:    General: Skin is warm and dry.     Capillary Refill: Capillary refill takes less than 2  seconds.  Neurological:     General: No focal deficit present.     Mental Status: She is alert and oriented to person, place, and time.  Psychiatric:        Attention and Perception: Attention and perception normal.        Mood and Affect: Mood and affect normal.        Speech: Speech normal.        Behavior: Behavior normal. Behavior is cooperative.        Thought Content: Thought content normal.        Cognition and Memory: Cognition and memory normal.        Judgment: Judgment normal.     Results for orders placed or performed during the hospital encounter of 04/04/18  CBC  Result Value Ref Range   WBC 16.5 (H) 4.0 - 10.5 K/uL   RBC 3.84 (L) 3.87 - 5.11 MIL/uL   Hemoglobin 10.2 (L) 12.0 - 15.0 g/dL   HCT 02.7 (L) 36 - 46 %   MCV 80.5 80.0  - 100.0 fL   MCH 26.6 26.0 - 34.0 pg   MCHC 33.0 30.0 - 36.0 g/dL   RDW 25.3 66.4 - 40.3 %   Platelets 239 150 - 400 K/uL  RPR  Result Value Ref Range   RPR Ser Ql Non Reactive Non Reactive  Type and screen [facility] [place]  Result Value Ref Range   ABO/RH(D) A POS    Antibody Screen NEG    Sample Expiration      04/08/2018 Performed at [facility], [address] [place], [place] [address]   ABO/Rh  Result Value Ref Range   ABO/RH(D)      A POS Performed at [facility], [address], [place], [place] [address]       Assessment & Plan:   Problem List Items Addressed This Visit      Other   Cramp and spasm    Continued lumbar paraspinal cramp/spasm, likely with underlying muscle strain.  Has tried ibuprofen, acetaminophen and cyclobenzaprine without relief of symptoms.  Interested in proceeding to orthopedics for evaluation.  Plan: 1. Refill of cyclobenzaprine sent to pharmacy, is providing mild relief in the evenings to relax back muscles for sleep 2. Referral to Orthopedics placed 3. RTC in 3 months for CPE      Relevant Medications   cyclobenzaprine (FLEXERIL) 10 MG tablet   Other Relevant Orders   AMB referral to orthopedics      Meds ordered this encounter  Medications  . cyclobenzaprine (FLEXERIL) 10 MG tablet    Sig: Take 0.5-1 tablets (5-10 mg total) by mouth 3 (three) times daily as needed for muscle spasms.    Dispense:  30 tablet    Refill:  0      Follow up plan: Return in about 3 months (around 01/02/2020) for CPE.   Charlaine Dalton, FNP Family Nurse Practitioner [facility] 10/03/2019, 8:09 AM

## 2019-10-02 NOTE — Patient Instructions (Signed)
.  A referral to Orthopedics has been placed today.  If you have not heard from the specialty office or our referral coordinator within 1 week, please let us know and we will follow up with the referral coordinator for an update.  I have sent in a refill on the cyclobenzaprine.  Be sure not to drive or operate heavy machinery while taking this medication.  Continue your home exercises and massage.  We will plan to see you back as needed for this and in 3 months for your physical  You will receive a survey after today's visit either digitally by e-mail or paper by USPS mail. Your experiences and feedback matter to [place].  Please respond so we know how we are doing as we provide care for you.  Call us with any questions/concerns/needs.  It is my goal to be available to you for your health concerns.  Thanks for choosing me to be a partner in your healthcare needs!  Charlaine Dalton, FNP-C Family Nurse Practitioner [facility] Phone: [phone]

## 2019-10-03 NOTE — Assessment & Plan Note (Signed)
Continued lumbar paraspinal cramp/spasm, likely with underlying muscle strain.  Has tried ibuprofen, acetaminophen and cyclobenzaprine without relief of symptoms.  Interested in proceeding to orthopedics for evaluation.  Plan: 1. Refill of cyclobenzaprine sent to pharmacy, is providing mild relief in the evenings to relax back muscles for sleep 2. Referral to Orthopedics placed 3. RTC in 3 months for CPE

## 2019-10-22 DIAGNOSIS — Z419 Encounter for procedure for purposes other than remedying health state, unspecified: Secondary | ICD-10-CM | POA: Diagnosis not present

## 2020-03-05 ENCOUNTER — Other Ambulatory Visit: Payer: Self-pay | Admitting: Family Medicine

## 2020-03-05 DIAGNOSIS — B360 Pityriasis versicolor: Secondary | ICD-10-CM

## 2020-03-05 NOTE — Telephone Encounter (Signed)
No protocol assigned.  Sending to provider for review    Requested Prescriptions  Pending Prescriptions Disp Refills   fluconazole (DIFLUCAN) 150 MG tablet [Pharmacy Med Name: FLUCONAZOLE 150 MG TABLET] 4 tablet 3    Sig: Take 2 tablets every 2 weeks      Off-Protocol Failed - 03/05/2020 11:21 AM      Failed - Medication not assigned to a protocol, review manually.      Passed - Valid encounter within last 12 months    Recent Outpatient Visits           5 months ago Cramp and spasm   [facility], Jodelle Gross, FNP   5 months ago Establishing care with new doctor, encounter for   [facility], Jodelle Gross, [place]

## 2020-08-06 IMAGING — US US RENAL
1 series · 14 of 25 positions shown · non-contrast
Comparison: None available.

CLINICAL DATA: Initial evaluation for acute flank pain. Currently
pregnant.

EXAM:
RENAL / URINARY TRACT ULTRASOUND COMPLETE

[Series 1: us renal · 14 of 37 slices shown]
[im 1/37]
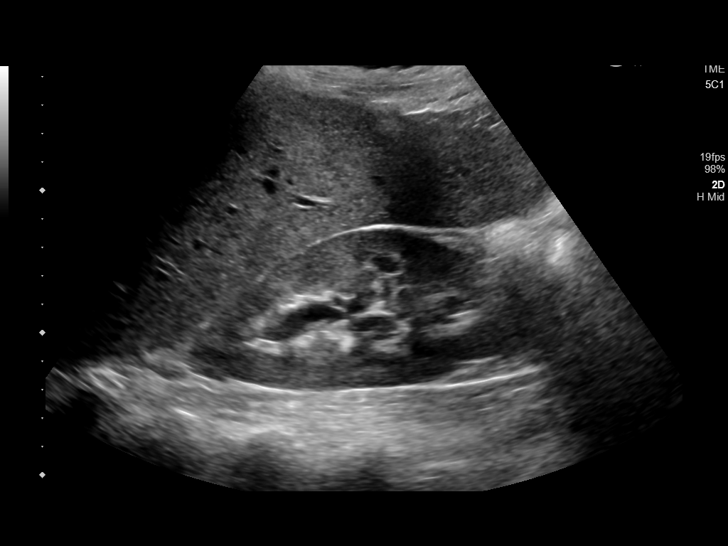
[im 4/37]
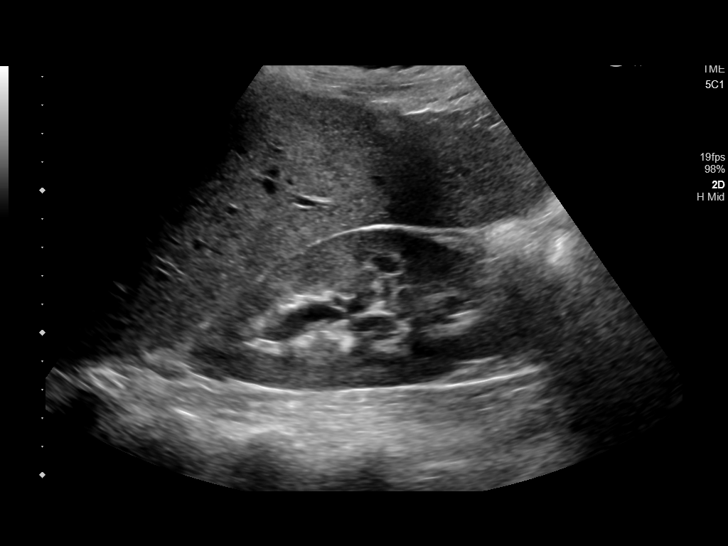
[im 7/37]
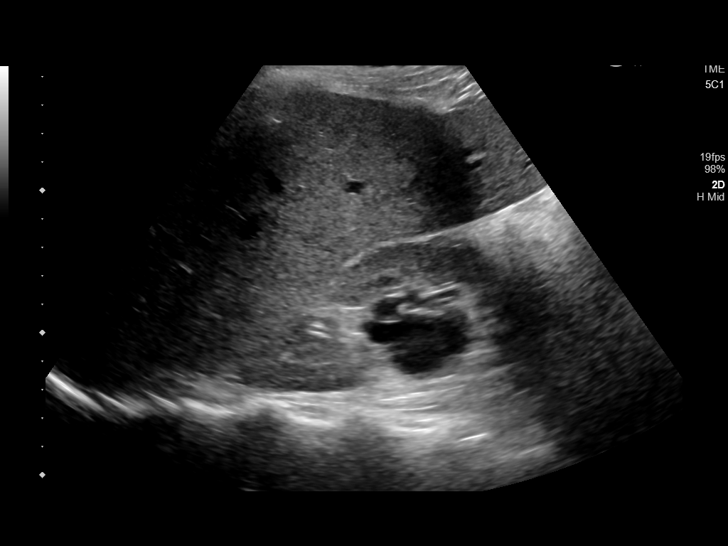
[im 10/37]
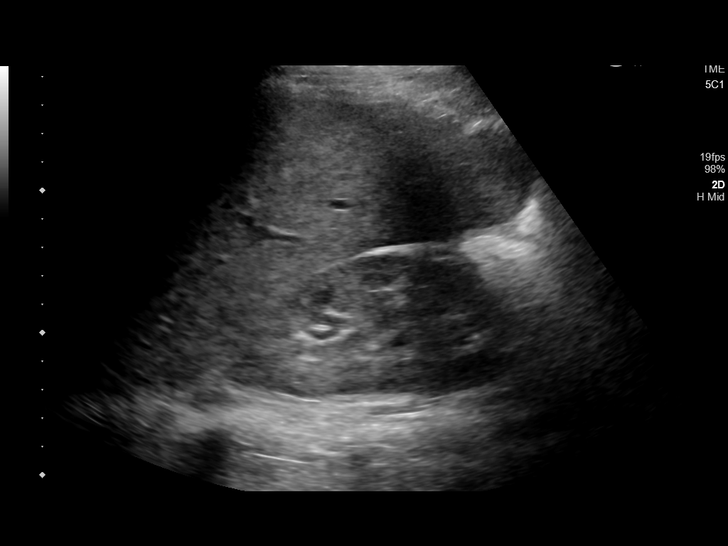
[im 13/37]
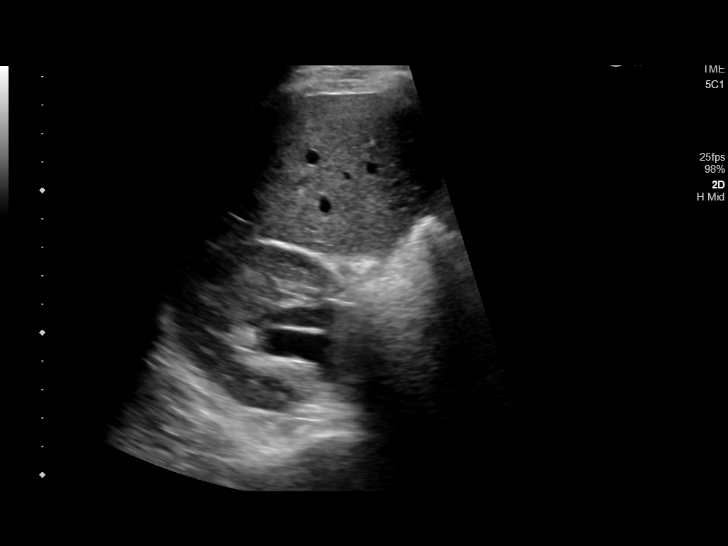
[im 14/37]
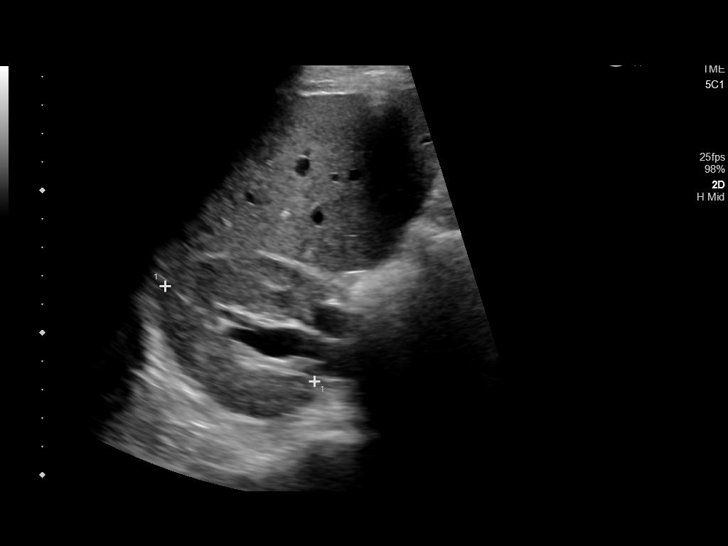
[im 17/37]
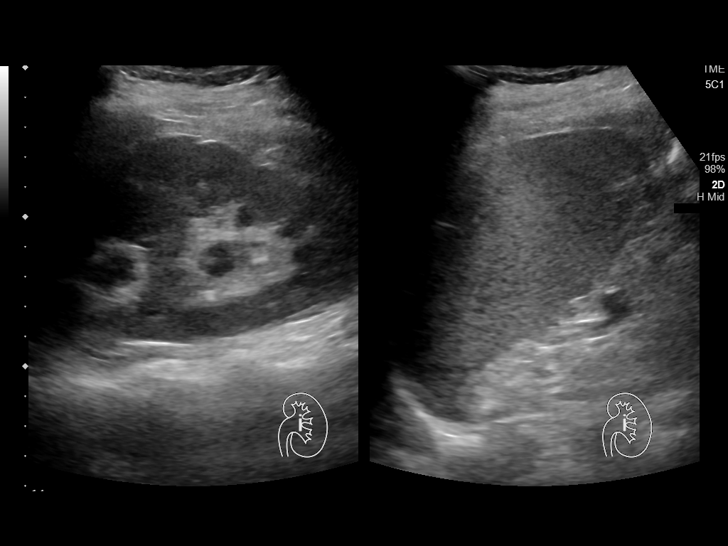
[im 20/37]
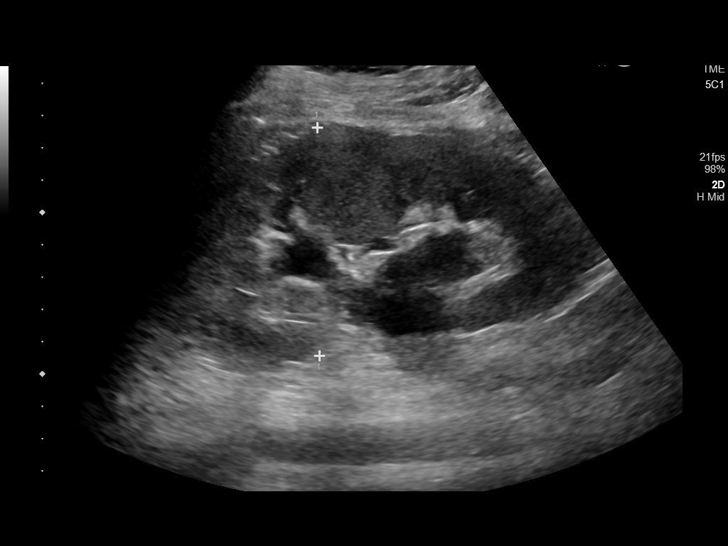
[im 23/37]
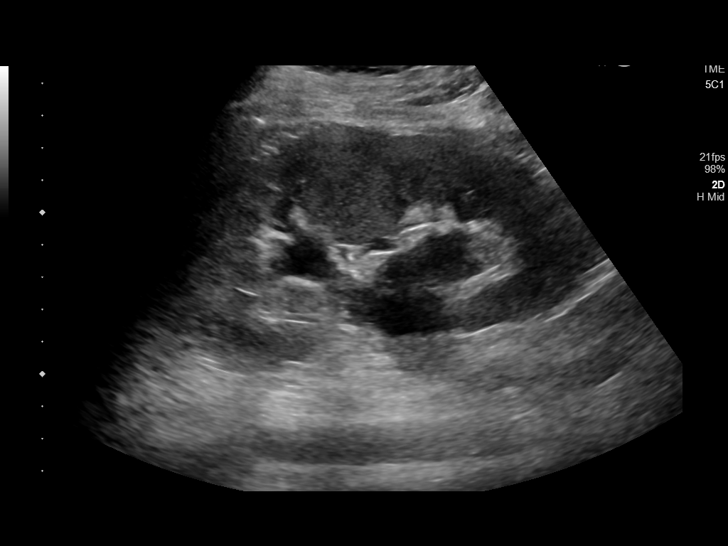
[im 25/37]
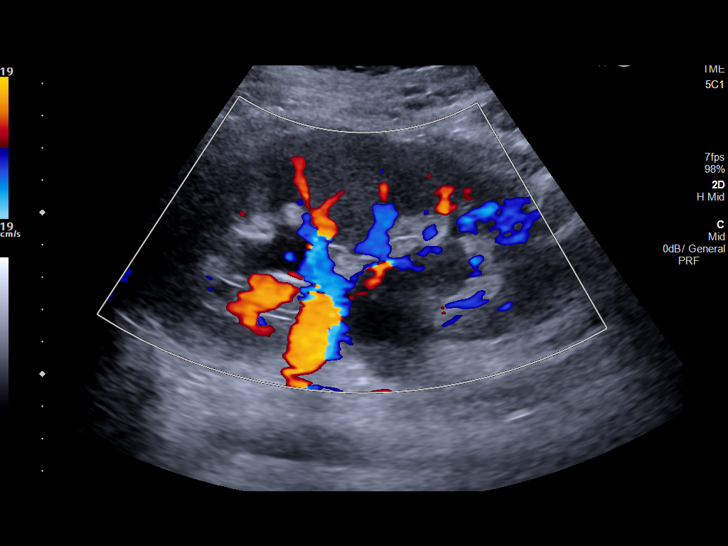
[im 28/37]
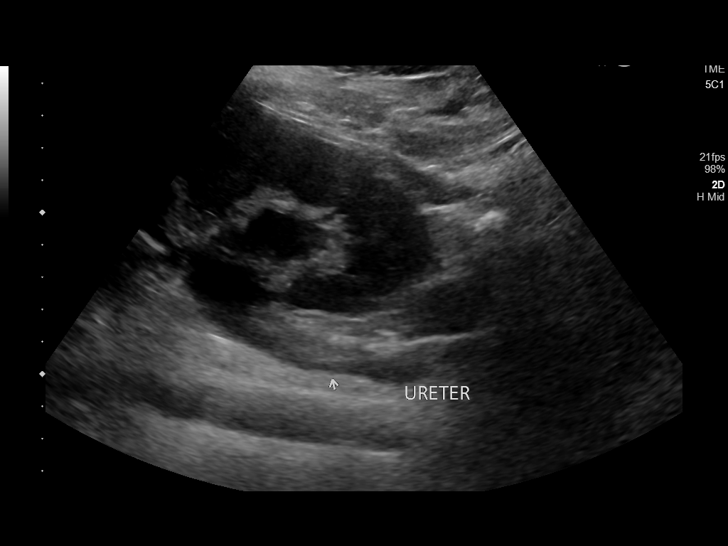
[im 31/37]
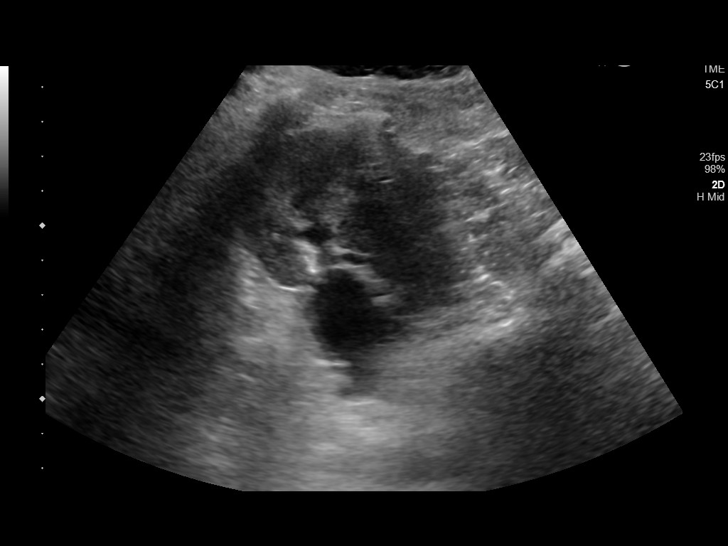
[im 34/37]
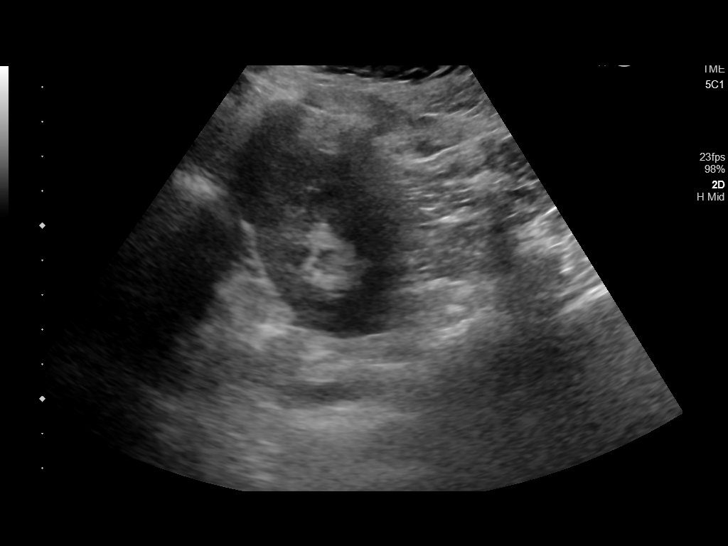
[im 37/37]
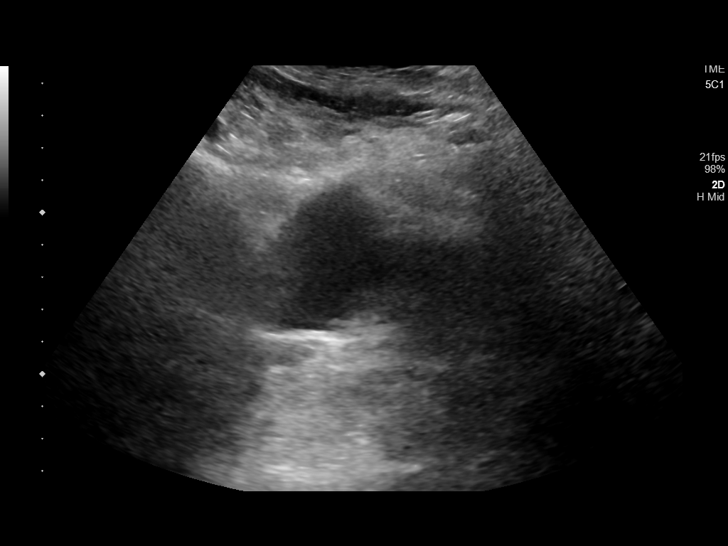

[14 of 25 positions shown; findings below may reference images not displayed]

FINDINGS: Right Kidney:

Renal measurements: 12.5 x 5.8 x 6.2 cm = volume: 233.7 mL .
Echogenicity within normal limits. No mass lesion. Moderate
hydronephrosis. No shadowing echogenic calculi.

Left Kidney:

Renal measurements: 13.8 x 7.1 x 5.4 cm = volume: 276.8 mL.
Echogenicity within normal limits. No mass lesion. Moderate
hydronephrosis. No shadowing echogenic calculi.

Bladder:

Appears normal for degree of bladder distention.
IMPRESSION: Fairly symmetric moderate bilateral hydronephrosis, suspected to be
related to current pregnancy. Possible distal obstructive stones
would be difficult to exclude. Correlation with urinalysis
suggested.
